# Patient Record
Sex: Female | Born: 1955 | Race: White | Hispanic: No | Marital: Married | Smoking: Never smoker
Health system: Southern US, Community
[De-identification: ages and names within clinical notes are randomized; demographics above are authoritative.]

## PROBLEM LIST (undated history)

## (undated) DIAGNOSIS — D219 Benign neoplasm of connective and other soft tissue, unspecified: Secondary | ICD-10-CM

## (undated) DIAGNOSIS — I10 Essential (primary) hypertension: Secondary | ICD-10-CM

## (undated) DIAGNOSIS — K219 Gastro-esophageal reflux disease without esophagitis: Secondary | ICD-10-CM

## (undated) DIAGNOSIS — E039 Hypothyroidism, unspecified: Secondary | ICD-10-CM

## (undated) DIAGNOSIS — I447 Left bundle-branch block, unspecified: Secondary | ICD-10-CM

## (undated) HISTORY — DX: Hypothyroidism, unspecified: E03.9

## (undated) HISTORY — DX: Essential (primary) hypertension: I10

## (undated) HISTORY — PX: TUBAL LIGATION: SHX77

## (undated) HISTORY — DX: Benign neoplasm of connective and other soft tissue, unspecified: D21.9

## (undated) HISTORY — PX: OTHER SURGICAL HISTORY: SHX169

## (undated) HISTORY — DX: Left bundle-branch block, unspecified: I44.7

## (undated) HISTORY — PX: POLYPECTOMY: SHX149

## (undated) HISTORY — PX: CHOLECYSTECTOMY: SHX55

## (undated) HISTORY — PX: COLONOSCOPY: SHX174

## (undated) HISTORY — PX: MOUTH SURGERY: SHX715

## (undated) HISTORY — DX: Gastro-esophageal reflux disease without esophagitis: K21.9

---

## 1998-05-04 ENCOUNTER — Other Ambulatory Visit: Admission: RE | Admit: 1998-05-04 | Discharge: 1998-05-04 | Payer: Self-pay | Admitting: Obstetrics & Gynecology

## 1999-06-11 ENCOUNTER — Other Ambulatory Visit: Admission: RE | Admit: 1999-06-11 | Discharge: 1999-06-11 | Payer: Self-pay | Admitting: Obstetrics & Gynecology

## 2000-01-07 ENCOUNTER — Encounter: Admission: RE | Admit: 2000-01-07 | Discharge: 2000-01-07 | Payer: Self-pay | Admitting: Internal Medicine

## 2000-01-07 ENCOUNTER — Encounter: Payer: Self-pay | Admitting: Internal Medicine

## 2000-07-24 ENCOUNTER — Other Ambulatory Visit: Admission: RE | Admit: 2000-07-24 | Discharge: 2000-07-24 | Payer: Self-pay | Admitting: Obstetrics & Gynecology

## 2001-01-26 ENCOUNTER — Encounter: Admission: RE | Admit: 2001-01-26 | Discharge: 2001-01-26 | Payer: Self-pay | Admitting: Obstetrics & Gynecology

## 2001-01-26 ENCOUNTER — Encounter: Payer: Self-pay | Admitting: Obstetrics & Gynecology

## 2001-10-04 ENCOUNTER — Encounter: Payer: Self-pay | Admitting: Internal Medicine

## 2001-10-04 ENCOUNTER — Ambulatory Visit (HOSPITAL_COMMUNITY): Admission: RE | Admit: 2001-10-04 | Discharge: 2001-10-04 | Payer: Self-pay | Admitting: Internal Medicine

## 2001-10-08 ENCOUNTER — Encounter: Payer: Self-pay | Admitting: Surgery

## 2001-10-08 ENCOUNTER — Encounter (INDEPENDENT_AMBULATORY_CARE_PROVIDER_SITE_OTHER): Payer: Self-pay | Admitting: *Deleted

## 2001-10-08 ENCOUNTER — Ambulatory Visit (HOSPITAL_COMMUNITY): Admission: RE | Admit: 2001-10-08 | Discharge: 2001-10-09 | Payer: Self-pay | Admitting: Surgery

## 2002-03-12 ENCOUNTER — Other Ambulatory Visit: Admission: RE | Admit: 2002-03-12 | Discharge: 2002-03-12 | Payer: Self-pay | Admitting: Obstetrics & Gynecology

## 2002-03-29 ENCOUNTER — Encounter: Admission: RE | Admit: 2002-03-29 | Discharge: 2002-03-29 | Payer: Self-pay | Admitting: Obstetrics & Gynecology

## 2002-03-29 ENCOUNTER — Encounter: Payer: Self-pay | Admitting: Obstetrics & Gynecology

## 2003-04-11 ENCOUNTER — Encounter: Admission: RE | Admit: 2003-04-11 | Discharge: 2003-04-11 | Payer: Self-pay | Admitting: Obstetrics & Gynecology

## 2003-07-11 ENCOUNTER — Other Ambulatory Visit: Admission: RE | Admit: 2003-07-11 | Discharge: 2003-07-11 | Payer: Self-pay | Admitting: Obstetrics & Gynecology

## 2003-08-21 ENCOUNTER — Encounter: Payer: Self-pay | Admitting: Internal Medicine

## 2004-08-19 ENCOUNTER — Other Ambulatory Visit: Admission: RE | Admit: 2004-08-19 | Discharge: 2004-08-19 | Payer: Self-pay | Admitting: Obstetrics and Gynecology

## 2004-09-09 ENCOUNTER — Encounter: Admission: RE | Admit: 2004-09-09 | Discharge: 2004-09-09 | Payer: Self-pay | Admitting: Obstetrics & Gynecology

## 2004-10-12 ENCOUNTER — Ambulatory Visit: Payer: Self-pay | Admitting: Internal Medicine

## 2005-06-24 ENCOUNTER — Ambulatory Visit: Payer: Self-pay | Admitting: Internal Medicine

## 2005-09-16 ENCOUNTER — Encounter: Admission: RE | Admit: 2005-09-16 | Discharge: 2005-09-16 | Payer: Self-pay | Admitting: Obstetrics & Gynecology

## 2005-10-12 ENCOUNTER — Ambulatory Visit: Payer: Self-pay | Admitting: Internal Medicine

## 2006-04-25 HISTORY — PX: COLONOSCOPY W/ POLYPECTOMY: SHX1380

## 2006-06-09 ENCOUNTER — Ambulatory Visit: Payer: Self-pay | Admitting: Internal Medicine

## 2006-10-17 ENCOUNTER — Encounter: Admission: RE | Admit: 2006-10-17 | Discharge: 2006-10-17 | Payer: Self-pay | Admitting: Obstetrics & Gynecology

## 2006-10-17 ENCOUNTER — Ambulatory Visit: Payer: Self-pay | Admitting: Internal Medicine

## 2006-10-17 DIAGNOSIS — E039 Hypothyroidism, unspecified: Secondary | ICD-10-CM | POA: Insufficient documentation

## 2006-10-17 DIAGNOSIS — E785 Hyperlipidemia, unspecified: Secondary | ICD-10-CM | POA: Insufficient documentation

## 2006-10-17 LAB — CONVERTED CEMR LAB
AST: 28 units/L (ref 0–37)
Basophils Absolute: 0.1 10*3/uL (ref 0.0–0.1)
Basophils Relative: 0.8 % (ref 0.0–1.0)
CO2: 28 meq/L (ref 19–32)
Calcium: 9.3 mg/dL (ref 8.4–10.5)
Eosinophils Relative: 6.9 % — ABNORMAL HIGH (ref 0.0–5.0)
GFR calc Af Amer: 114 mL/min
Glucose, Bld: 95 mg/dL (ref 70–99)
HCT: 41.5 % (ref 36.0–46.0)
HDL: 42.1 mg/dL (ref >39.0)
LDL Cholesterol: 111 mg/dL — ABNORMAL HIGH (ref 0–99)
Lymphocytes Relative: 34 % (ref 12.0–46.0)
Monocytes Relative: 9.2 % (ref 3.0–11.0)
Neutrophils Relative %: 49.1 % (ref 43.0–77.0)
Potassium: 4 meq/L (ref 3.5–5.1)
RDW: 11.7 % (ref 11.5–14.6)
TSH: 0.21 microintl units/mL — ABNORMAL LOW (ref 0.35–5.50)
Total CHOL/HDL Ratio: 4
Triglycerides: 80 mg/dL (ref 0–149)
VLDL: 16 mg/dL (ref 0–40)
WBC: 8.4 10*3/uL (ref 4.5–10.5)

## 2006-10-18 ENCOUNTER — Encounter (INDEPENDENT_AMBULATORY_CARE_PROVIDER_SITE_OTHER): Payer: Self-pay | Admitting: *Deleted

## 2006-12-22 ENCOUNTER — Ambulatory Visit: Payer: Self-pay | Admitting: Gastroenterology

## 2006-12-29 ENCOUNTER — Ambulatory Visit: Payer: Self-pay | Admitting: Gastroenterology

## 2006-12-29 ENCOUNTER — Encounter: Payer: Self-pay | Admitting: Gastroenterology

## 2006-12-29 ENCOUNTER — Encounter: Payer: Self-pay | Admitting: Internal Medicine

## 2007-01-01 LAB — HM COLONOSCOPY: HM COLON: NEGATIVE

## 2007-11-02 ENCOUNTER — Encounter: Admission: RE | Admit: 2007-11-02 | Discharge: 2007-11-02 | Payer: Self-pay | Admitting: Obstetrics & Gynecology

## 2007-11-16 ENCOUNTER — Encounter: Payer: Self-pay | Admitting: Internal Medicine

## 2007-12-13 ENCOUNTER — Ambulatory Visit: Payer: Self-pay | Admitting: Internal Medicine

## 2007-12-13 DIAGNOSIS — I10 Essential (primary) hypertension: Secondary | ICD-10-CM | POA: Insufficient documentation

## 2007-12-13 DIAGNOSIS — E559 Vitamin D deficiency, unspecified: Secondary | ICD-10-CM | POA: Insufficient documentation

## 2007-12-13 LAB — CONVERTED CEMR LAB
HDL goal, serum: 50 mg/dL
LDL Goal: 160 mg/dL

## 2007-12-17 ENCOUNTER — Encounter (INDEPENDENT_AMBULATORY_CARE_PROVIDER_SITE_OTHER): Payer: Self-pay | Admitting: *Deleted

## 2007-12-17 LAB — CONVERTED CEMR LAB
BUN: 7 mg/dL (ref 6–23)
Basophils Relative: 1.4 % (ref 0.0–3.0)
Calcium: 8.9 mg/dL (ref 8.4–10.5)
Cholesterol: 196 mg/dL (ref 0–200)
GFR calc Af Amer: 136 mL/min
GFR calc non Af Amer: 112 mL/min
Glucose, Bld: 84 mg/dL (ref 70–99)
HDL: 33.3 mg/dL — ABNORMAL LOW (ref >39.0)
LDL Cholesterol: 142 mg/dL — ABNORMAL HIGH (ref 0–99)
Lymphocytes Relative: 47.8 % — ABNORMAL HIGH (ref 12.0–46.0)
MCHC: 34.3 g/dL (ref 30.0–36.0)
Monocytes Relative: 10 % (ref 3.0–12.0)
Neutro Abs: 2.5 10*3/uL (ref 1.4–7.7)
Neutrophils Relative %: 34.7 % — ABNORMAL LOW (ref 43.0–77.0)
RBC: 4.21 M/uL (ref 3.87–5.11)
Sodium: 139 meq/L (ref 135–145)
Total Bilirubin: 0.8 mg/dL (ref 0.3–1.2)
VLDL: 21 mg/dL (ref 0–40)

## 2008-11-03 ENCOUNTER — Encounter: Admission: RE | Admit: 2008-11-03 | Discharge: 2008-11-03 | Payer: Self-pay | Admitting: Internal Medicine

## 2008-11-14 ENCOUNTER — Encounter (INDEPENDENT_AMBULATORY_CARE_PROVIDER_SITE_OTHER): Payer: Self-pay | Admitting: *Deleted

## 2009-01-28 ENCOUNTER — Ambulatory Visit: Payer: Self-pay | Admitting: Internal Medicine

## 2009-01-28 DIAGNOSIS — Z860100 Personal history of colon polyps, unspecified: Secondary | ICD-10-CM | POA: Insufficient documentation

## 2009-01-29 ENCOUNTER — Encounter (INDEPENDENT_AMBULATORY_CARE_PROVIDER_SITE_OTHER): Payer: Self-pay | Admitting: *Deleted

## 2009-01-29 LAB — CONVERTED CEMR LAB
Albumin: 4.1 g/dL (ref 3.5–5.2)
BUN: 8 mg/dL (ref 6–23)
Bilirubin, Direct: 0.1 mg/dL (ref 0.0–0.3)
Cholesterol: 161 mg/dL (ref 0–200)
Eosinophils Relative: 7 % — ABNORMAL HIGH (ref 0.0–5.0)
GFR calc non Af Amer: 137.3 mL/min (ref >60)
HDL: 41.4 mg/dL (ref >39.00)
Hemoglobin: 13.8 g/dL (ref 12.0–15.0)
LDL Cholesterol: 104 mg/dL — ABNORMAL HIGH (ref 0–99)
Lymphocytes Relative: 39.1 % (ref 12.0–46.0)
Lymphs Abs: 2.9 10*3/uL (ref 0.7–4.0)
MCV: 94.3 fL (ref 78.0–100.0)
Monocytes Absolute: 0.7 10*3/uL (ref 0.1–1.0)
Monocytes Relative: 9.3 % (ref 3.0–12.0)
Neutro Abs: 3.2 10*3/uL (ref 1.4–7.7)
Neutrophils Relative %: 44.2 % (ref 43.0–77.0)
Sodium: 140 meq/L (ref 135–145)
Total Bilirubin: 0.7 mg/dL (ref 0.3–1.2)
Total CHOL/HDL Ratio: 4
Total Protein: 7.7 g/dL (ref 6.0–8.3)
WBC: 7.3 10*3/uL (ref 4.5–10.5)

## 2009-05-15 ENCOUNTER — Ambulatory Visit: Payer: Self-pay | Admitting: Family

## 2009-09-16 ENCOUNTER — Encounter: Payer: Self-pay | Admitting: Internal Medicine

## 2009-09-16 ENCOUNTER — Ambulatory Visit: Payer: Self-pay | Admitting: Family Medicine

## 2009-09-16 LAB — CONVERTED CEMR LAB
Ketones, urine, test strip: NEGATIVE
Nitrite: NEGATIVE
Protein, U semiquant: NEGATIVE
Urobilinogen, UA: 0.2
pH: 6

## 2009-11-04 ENCOUNTER — Encounter: Admission: RE | Admit: 2009-11-04 | Discharge: 2009-11-04 | Payer: Self-pay | Admitting: Obstetrics & Gynecology

## 2010-02-15 ENCOUNTER — Ambulatory Visit: Payer: Self-pay | Admitting: Internal Medicine

## 2010-02-15 ENCOUNTER — Encounter: Payer: Self-pay | Admitting: Internal Medicine

## 2010-02-16 LAB — CONVERTED CEMR LAB
ALT: 27 units/L (ref 0–35)
AST: 31 units/L (ref 0–37)
Basophils Absolute: 0 10*3/uL (ref 0.0–0.1)
Bilirubin, Direct: 0.1 mg/dL (ref 0.0–0.3)
CO2: 28 meq/L (ref 19–32)
Chloride: 104 meq/L (ref 96–112)
Glucose, Bld: 76 mg/dL (ref 70–99)
HCT: 38.7 % (ref 36.0–46.0)
Hemoglobin: 13.4 g/dL (ref 12.0–15.0)
Lymphocytes Relative: 32.9 % (ref 12.0–46.0)
MCHC: 34.6 g/dL (ref 30.0–36.0)
MCV: 94.7 fL (ref 78.0–100.0)
Platelets: 257 10*3/uL (ref 150.0–400.0)
Potassium: 3.9 meq/L (ref 3.5–5.1)
RDW: 12.9 % (ref 11.5–14.6)
Sodium: 140 meq/L (ref 135–145)
Total Bilirubin: 0.7 mg/dL (ref 0.3–1.2)
Total CHOL/HDL Ratio: 4
VLDL: 13.4 mg/dL (ref 0.0–40.0)

## 2010-05-25 NOTE — Assessment & Plan Note (Signed)
Summary: pain in right ear/lab work tsh   Vital Signs:  Patient profile:   55 year old female Weight:      180.2 pounds BMI:     30.56 Temp:     98.2 degrees F oral Pulse rate:   72 / minute BP sitting:   114 / 70  (left arm) Cuff size:   large  Vitals Entered By: Shonna Chock (May 15, 2009 8:34 AM) CC: Right ear pain, both ears feel full x several days  Comments REVIEWED MED LIST, PATIENT AGREED DOSE AND INSTRUCTION CORRECT    CC:  Right ear pain and both ears feel full x several days .  History of Present Illness: Kaitlin Carter is a 55 year old female patient who presents today with c/o bilateral ear pain right greater than left.  Notes that pain this pain work her up on Wednesday night.  Prior to that she noted some ear fullness. Has taken advil with some relief in her pain.    Allergies: 1)  ! Verapamil 2)  ! Maxzide 3)  ! * Lotrel 4)  ! * Decongestants  Review of Systems       Denies fever of ear drainage.    Physical Exam  General:  Well-developed,well-nourished,in no acute distress; alert,appropriate and cooperative throughout examination Eyes:  PERRLA Ears:  Bilateral TM's mild bulging, pink Mouth:  Oral mucosa and oropharynx without lesions or exudates.  Teeth in good repair. Neck:  No deformities, masses, or tenderness noted. Lungs:  Normal respiratory effort, chest expands symmetrically. Lungs are clear to auscultation, no crackles or wheezes. Heart:  Normal rate and regular rhythm. S1 and S2 normal without gallop, murmur, click, rub or other extra sounds.   Impression & Recommendations:  Problem # 1:  OTITIS MEDIA, ACUTE, BILATERAL (ICD-382.9) Assessment New Patient will hold doxycycline which she has been on for 3 weeks for rosacea.  Plan to treat OM with amoxicillin- then resume doxy.   Her updated medication list for this problem includes:    Amoxicillin 500 Mg Caps (Amoxicillin) ..... One tablet by mouth three times a day x 7 days    Doxycycline  Hyclate 50 Mg Caps (Doxycycline hyclate) ..... One tablet by mouth daily  Complete Medication List: 1)  Synthroid 150 Mcg Tabs (Levothyroxine sodium) .Marland Kitchen.. 1 by mouth once daily except 1/2 q tues & sat 2)  Vit-d 1.25mg   .... 1 by mouth weekly (sunday) 3)  Losartan Potassium 100 Mg Tabs (Losartan potassium) .Marland Kitchen.. 1 once daily 4)  Caltrate 600+d 600-400 Mg-unit Tabs (Calcium carbonate-vitamin d) .Marland Kitchen.. 1 by mouth once daily 5)  Multivitamins Tabs (Multiple vitamin) .Marland Kitchen.. 1 by mouth once daily 6)  Amoxicillin 500 Mg Caps (Amoxicillin) .... One tablet by mouth three times a day x 7 days 7)  Doxycycline Hyclate 50 Mg Caps (Doxycycline hyclate) .... One tablet by mouth daily  Other Orders: TLB-TSH (Thyroid Stimulating Hormone) (84443-TSH)  Patient Instructions: 1)  You can hold doxycycline while you are on amoxicillin. 2)  Call if your symptoms worsen or if they are not better in 1 week.  3)  Use an OTC monistat 7 as needed if you develop a yeast infection. Prescriptions: AMOXICILLIN 500 MG CAPS (AMOXICILLIN) one tablet by mouth three times a day X 7 days  #21 x 0   Entered and Authorized by:   Lemont Fillers FNP   Signed by:   Lemont Fillers FNP on 05/15/2009   Method used:   [facility]  [place] [phone][facility] (retail)       [address] [place], [place]  [address]       Ph: [phone] or [phone]       Fax: [phone]   RxID:   [id number]
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
## 2010-05-25 NOTE — Letter (Signed)
   [facility] [address] [place], [place] [address] [phone]    May 15, 2009   Kaitlin Carter [address] [place], [place] [address]  RE:  LAB RESULTS  Dear  [name],  The following is an interpretation of your most recent lab tests.  Please take note of any instructions provided or changes to medications that have resulted from your lab work.   THYROID STUDIES:  Thyroid studies normal TSH: 3.33      Sincerely Yours,    Lemont Fillers FNP

## 2010-05-25 NOTE — Assessment & Plan Note (Signed)
Summary: URINARY URGENCY/RH......Marland Kitchen   Vital Signs:  Patient profile:   55 year old female Weight:      182 pounds Temp:     98.4 degrees F oral BP sitting:   138 / 80  (left arm)  Vitals Entered By: Doristine Devoid (Sep 16, 2009 1:53 PM) CC: urinary frequency and bloating along w/ stomach discomfort   History of Present Illness: 55 yo woman here today for urinary frequency.  sxs started last week.  noticed after sexual activity.  started drinking lots of water.  on Saturday/"Sunday had constant urge to urinate but had little to no urine.  took pyridium which eased sxs but they returned yesterday afternoon.  now w/ abdominal bloating and 'just not feeling right'.  no dysuria, no hematuria.  cloudy urine.  Current Medications (verified): 1)  Synthroid 150 Mcg  Tabs (Levothyroxine Sodium) .... 1 By Mouth Once Daily Except 1/2 Q Tues & Sat 2)  Vit-D 1.25mg .... 1 By Mouth Weekly (Sunday) 3)  Losartan Potassium 100 Mg Tabs (Losartan Potassium) .... 1 Once Daily 4)  Caltrate 600+d 600-400 Mg-Unit Tabs (Calcium Carbonate-Vitamin D) .... 1 By Mouth Once Daily 5)  Multivitamins  Tabs (Multiple Vitamin) .... 1 By Mouth Once Daily  Allergies (verified): 1)  ! Verapamil 2)  ! Maxzide 3)  ! * Lotrel 4)  ! * Decongestants  Review of Systems      See HPI  Physical Exam  General:  Well-developed,well-nourished,in no acute distress; alert,appropriate and cooperative throughout examination Abdomen:  no CVA tenderness, mild suprapubic pain   Impression & Recommendations:  Problem # 1:  UTI (ICD-599.0) Assessment Unchanged UA consistent w/ infxn.  start abx.  reviewed supportive care and red flags that should prompt return.  Pt expresses understanding and is in agreement w/ this plan. The following medications were removed from the medication list:    Amoxicillin 500 Mg Caps (Amoxicillin) ..... One tablet by mouth three times a day x 7 days    Doxycycline Hyclate 50 Mg Caps (Doxycycline  hyclate) ..... One tablet by mouth daily Her updated medication list for this problem includes:    Cephalexin 500 Mg Tabs (Cephalexin) ..... Take one by mouth 2 times daily x7 days  Orders: UA Dipstick w/o Micro (manual) (81002) Specimen Handling (99000) T-Culture, Urine (87086-70010)  Complete Medication List: 1)  Synthroid 150 Mcg Tabs (Levothyroxine sodium) .... 1 by mouth once daily except 1/2 q tues & sat 2)  Vit-d 1.25mg  .... 1 by mouth weekly (sunday) 3)  Losartan Potassium 100 Mg Tabs (Losartan potassium) .... 1 once daily 4)  Caltrate 600+d 600-400 Mg-unit Tabs (Calcium carbonate-vitamin d) .... 1 by mouth once daily 5)  Multivitamins Tabs (Multiple vitamin) .... 1 by mouth once daily 6)  Cephalexin 500 Mg Tabs (Cephalexin) .... Take one by mouth 2 times daily x7 days  Patient Instructions: 1)  Take the Cephalexin as directed- take w/ food to avoid upset stomach 2)  Drink plenty of fluids 3)  Call with any questions or concerns 4)  Hang in there!! Prescriptions: CEPHALEXIN 500 MG  TABS (CEPHALEXIN) take one by mouth 2 times daily x7 days  #14 x 0   Entered and Authorized by:   Praise Dolecki MD   Signed by:   Ece Cumberland MD on 09/16/2009   Method used:   [facility]  [address] [id number]       [address]  [place], [place]  [address]       Ph: [phone] or [phone]       Fax: [phone]   RxID:   [id number]   Laboratory Results   Urine Tests    Routine Urinalysis   Glucose: negative   (Normal Range: Negative) Bilirubin: negative   (Normal Range: Negative) Ketone: negative   (Normal Range: Negative) Spec. Gravity: 1.015   (Normal Range: 1.003-1.035) Blood: small   (Normal Range: Negative) pH: 6.0   (Normal Range: 5.0-8.0) Protein: negative   (Normal Range: Negative) Urobilinogen: 0.2   (Normal Range: 0-1) Nitrite: negative   (Normal Range: Negative) Leukocyte Esterace: small   (Normal Range: Negative)

## 2010-09-10 NOTE — Op Note (Signed)
. [facility]  Patient:    TANIKA, BRACCO Visit Number: [id number] MRN: [id number]          Service Type: DSU Location: [id number] Attending Physician:  Charlton Haws Dictated by:   Currie Paris, M.D. Proc. Date: 10/08/01 Admit Date:  10/08/2001 Discharge Date: 10/09/2001   CC:         Titus Dubin. Alwyn Ren, M.D. 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  OPERATION:  Laparoscopic cholecystectomy with intraoperative cholangiogram.  SURGEON:  Currie Paris, M.D.  ASSISTANT:  Angelia Mould. Derrell Lolling, M.D.  ANESTHESIA:  General endotracheal.  CLINICAL HISTORY:  Ms. Gauer is a 55 year old who has had three fairly severe episodes of biliary colic in the near past and has had some milder episodes previously.  Ultrasound showed gallstones and question of a little bit of fluid developing around the neck of the gallbladder.  Liver functions were normal.  She was a little bit tender in the right upper quadrant. Options were discussed with her whether or not she would like to go ahead and proceed with cholecystectomy.  It was decided to go ahead and get her on fairly soon since she had had two fairly significant episodes and the question of some developing acute cholecystitis.  DESCRIPTION OF PROCEDURE:  The patient was seen in the holding area and had no further questions.  She was taken to the operating room, and, after satisfactory general endotracheal anesthesia had been obtained, the abdomen was  prepped, and draped.  I used 0.25% plain Marcaine at each incision.  The umbilical incision was made first and the fascia entered under direct vision. A pursestring was placed and the Hasson  introduced.  The abdomen insufflated to 15.  No gross abnormalities were noted.  The patient was placed in reverse Trendelenburg and tilted to the left.  A 10-11 trocar was placed in the epigastrium and two 5 mm trocars laterally. The gallbladder was retracted over the liver.  There was perhaps a little bit of edema around the neck of the gallbladder but no evidence of true acute cholecystitis.  The peritoneum overlying the triangle of Calot was opened, and the perineum was opened up on either side of the gallbladder to try to see well into the triangle.  The cystic duct was well identified and traced out. I could see what I thought initially was the cystic artery, and it came alongside the cystic duct and then curved up along the bed of the gallbladder, and I was not at this point sure whether it curved back into the liver or if it was another branch of the right hepatic or if this was the cystic artery.  At this point, I went ahead and put a clip on the cystic duct near the junction with the gallbladder and opened it.  Intraoperative cholangiography was done and was normal with good filling of the common duct and the hepatic radicles and no filling defects and free flow into the duodenum.  At this point, the cystic duct catheter was removed and three clips placed on the stay side, and it was divided.  Using cautery, I freed up the gallbladder peritoneum a little bit more and followed that small artery up and continued to track along the bed  of the gallbladder.  I saw a couple of adventitial branches that were clipped singly and divided with cautery.  About three-quarters of the way up into the gallbladder, I saw a branch of the artery entering the gallbladder.  This was dissected off, clipped, and divided.  I did not dissect this any further as I was afraid of stirring up some bleeding, but this could have been a branch off of the artery I had seen before, or this artery simply could  have had a high entry on the gallbladder. Nevertheless, there was no bleeding, and I was really almost to the top of the gallbladder at this point.  The remaining peritoneal adhesions were divided.  The gallbladder fossa was irrigated and suctioned out.  There was no bleeding. The gallbladder was brought out the umbilical port easily.  The abdomen was reinflated, and we made a final check for hemostasis which appeared to be complete.  The lateral ports were removed, and there was no bleeding.  The umbilical port pursestring was closed down.  The abdomen was deflated through the epigastric port.  Skin was closed with 4-0 Monocryl subcuticular plus Steri-Strips.   The patient tolerated the procedure well.  There were no operative complications.  All counts were correct. Dictated by:   Currie Paris, M.D. Attending Physician:  Charlton Haws DD:  10/08/01 TD:  10/09/01 Job: [id number]

## 2010-10-19 ENCOUNTER — Other Ambulatory Visit: Payer: Self-pay | Admitting: Obstetrics & Gynecology

## 2010-10-19 DIAGNOSIS — Z1231 Encounter for screening mammogram for malignant neoplasm of breast: Secondary | ICD-10-CM

## 2010-11-08 ENCOUNTER — Ambulatory Visit
Admission: RE | Admit: 2010-11-08 | Discharge: 2010-11-08 | Disposition: A | Discharge status: Home / Self Care | Payer: BC Managed Care – PPO | Source: Ambulatory Visit | Attending: Obstetrics & Gynecology | Admitting: Obstetrics & Gynecology

## 2010-11-08 DIAGNOSIS — Z1231 Encounter for screening mammogram for malignant neoplasm of breast: Secondary | ICD-10-CM

## 2010-11-24 ENCOUNTER — Other Ambulatory Visit: Payer: Self-pay | Admitting: Internal Medicine

## 2010-11-24 MED ORDER — SYNTHROID 150 MCG PO TABS: 150.0000 ug | ORAL_TABLET | Freq: Every day | ORAL | Status: DC

## 2011-02-15 ENCOUNTER — Encounter: Payer: Self-pay | Admitting: Internal Medicine

## 2011-02-17 ENCOUNTER — Encounter: Payer: Self-pay | Admitting: Internal Medicine

## 2011-02-17 ENCOUNTER — Ambulatory Visit (INDEPENDENT_AMBULATORY_CARE_PROVIDER_SITE_OTHER): Payer: BC Managed Care – PPO | Admitting: Internal Medicine

## 2011-02-17 VITALS — BP 128/76 | HR 66 | Temp 98.4°F | Resp 12 | Ht 64.5 in | Wt 176.8 lb

## 2011-02-17 DIAGNOSIS — E039 Hypothyroidism, unspecified: Secondary | ICD-10-CM

## 2011-02-17 DIAGNOSIS — E559 Vitamin D deficiency, unspecified: Secondary | ICD-10-CM

## 2011-02-17 DIAGNOSIS — Z86010 Personal history of colonic polyps: Secondary | ICD-10-CM

## 2011-02-17 DIAGNOSIS — I447 Left bundle-branch block, unspecified: Secondary | ICD-10-CM

## 2011-02-17 DIAGNOSIS — Z0000 Encounter for general adult medical examination without abnormal findings: Secondary | ICD-10-CM

## 2011-02-17 DIAGNOSIS — E785 Hyperlipidemia, unspecified: Secondary | ICD-10-CM

## 2011-02-17 DIAGNOSIS — I10 Essential (primary) hypertension: Secondary | ICD-10-CM

## 2011-02-17 LAB — CBC WITH DIFFERENTIAL/PLATELET
Basophils Absolute: 0 10*3/uL (ref 0.0–0.1)
Basophils Relative: 0.5 % (ref 0.0–3.0)
Hemoglobin: 14 g/dL (ref 12.0–15.0)
Lymphs Abs: 2.4 10*3/uL (ref 0.7–4.0)
Monocytes Relative: 8.5 % (ref 3.0–12.0)
Neutro Abs: 4.3 10*3/uL (ref 1.4–7.7)
Neutrophils Relative %: 52.9 % (ref 43.0–77.0)
Platelets: 253 10*3/uL (ref 150.0–400.0)
RBC: 4.38 Mil/uL (ref 3.87–5.11)
RDW: 12.5 % (ref 11.5–14.6)
WBC: 8.1 10*3/uL (ref 4.5–10.5)

## 2011-02-17 LAB — BASIC METABOLIC PANEL
CO2: 29 mEq/L (ref 19–32)
Creatinine, Ser: 0.8 mg/dL (ref 0.4–1.2)
Glucose, Bld: 87 mg/dL (ref 70–99)
Potassium: 4.2 mEq/L (ref 3.5–5.1)

## 2011-02-17 LAB — TSH: TSH: 1.18 u[IU]/mL (ref 0.35–5.50)

## 2011-02-17 LAB — HEPATIC FUNCTION PANEL
AST: 34 U/L (ref 0–37)
Albumin: 4.4 g/dL (ref 3.5–5.2)
Alkaline Phosphatase: 67 U/L (ref 39–117)
Total Protein: 8.2 g/dL (ref 6.0–8.3)

## 2011-02-17 NOTE — Patient Instructions (Signed)
Preventive Health Care: Exercise  30-45  minutes a day, 3-4 days a week, once cleared by Cardiology. Walking is especially valuable in preventing Osteoporosis. Eat a low-fat diet with lots of fruits and vegetables, up to 7-9 servings per day.Consume less than 30 grams of sugar per day from foods & drinks with High Fructose Corn Syrup as # 1,2,3 or #4 on label.

## 2011-02-17 NOTE — Progress Notes (Signed)
Subjective:    Patient ID: Kaitlin Carter, female    DOB: 07/04/1955, 55 y.o.   MRN: [id number]  HPI  Kaitlin Carter  is here for a physical; she has no acute issues.       Review of Systems Patient reports no vision/ hearing  changes, adenopathy,fever, weight change,  persistant / recurrent hoarseness , swallowing issues, chest pain,palpitations,edema,persistant /recurrent cough, hemoptysis, dyspnea( rest/ exertional/paroxysmal nocturnal), gastrointestinal bleeding(melena, rectal bleeding), abdominal pain, significant heartburn,  bowel changes,GU symptoms(dysuria, hematuria,pyuria, incontinence), Gyn symptoms(abnormal  bleeding , pain),  syncope, focal weakness, memory loss,numbness & tingling, skin/hair /nail changes,abnormal bruising or bleeding, anxiety,or depression.  She's had some postnasal drainage related to raking leads. She denies any significant symptoms of rhinoconjunctivitis. She has not had menses for 3 months; her gynecologist office has prescribed Prometrium as a trial.  She does not  exercise on regular basis; she is on no specific dietary program.     Objective:   Physical Exam Gen.: Healthy and well-nourished in appearance. Alert, appropriate and cooperative throughout exam. Head: Normocephalic without obvious abnormalities  Eyes: No corneal or conjunctival inflammation noted. Pupils equal round reactive to light and accommodation. Fundal exam is benign without hemorrhages, exudate, papilledema. Extraocular motion intact. Vision grossly normal. Ears: External  ear exam reveals no significant lesions or deformities. Canals clear .TMs normal. Hearing is grossly normal bilaterally. Nose: External nasal exam reveals no deformity or inflammation. Nasal mucosa are pink and moist. No lesions or exudates noted.  Mouth: Oral mucosa and oropharynx reveal no lesions or exudates. Teeth in good repair. Neck: No deformities, masses, or tenderness noted. Range of motion &  Thyroid  normal. Lungs: Normal respiratory effort; chest expands symmetrically. Lungs are clear to auscultation without rales, wheezes, or increased work of breathing. Heart: Normal rate and rhythm. Normal S1 and S2. No gallop, click, or rub. No murmur. Abdomen: Bowel sounds normal; abdomen soft and nontender. No masses, organomegaly or hernias noted. Genitalia: as per Gyn   .                                                                                   Musculoskeletal/extremities: No deformity or scoliosis noted of  the thoracic or lumbar spine. No clubbing, cyanosis, edema, or deformity noted. Range of motion  normal .Tone & strength  Normal.Joints: minor DIP OA changes. Nail health  good. Vascular: Carotid, radial artery, dorsalis pedis and  posterior tibial pulses are full and equal. No bruits present. Neurologic: Alert and oriented x3. Deep tendon reflexes symmetrical and normal.          Skin: Intact without suspicious lesions or rashes. Lymph: No cervical, axillary lymphadenopathy present. Psych: Mood and affect are normal. Normally interactive  Assessment & Plan:  #1 comprehensive physical exam; no acute findings #2 see Problem List with Assessments & Recommendations  #3 strong family history of coronary disease. Premature MI in 1 maternal aunt  #4 left bundle branch block; new  #5 probable perimenopausal status. She is aware of the increased risk of cardiovascular disease in women in the  postmenopausal state. Plan: see Orders

## 2011-02-18 ENCOUNTER — Encounter: Payer: Self-pay | Admitting: Internal Medicine

## 2011-02-25 ENCOUNTER — Telehealth: Payer: Self-pay | Admitting: Internal Medicine

## 2011-02-25 NOTE — Telephone Encounter (Signed)
Lab result note only calls for OTC Loratadine.

## 2011-02-27 ENCOUNTER — Encounter: Payer: Self-pay | Admitting: Internal Medicine

## 2011-02-28 ENCOUNTER — Encounter: Payer: Self-pay | Admitting: Cardiology

## 2011-02-28 ENCOUNTER — Ambulatory Visit (INDEPENDENT_AMBULATORY_CARE_PROVIDER_SITE_OTHER): Payer: BC Managed Care – PPO | Admitting: Cardiology

## 2011-02-28 VITALS — BP 132/82 | HR 67 | Ht 64.5 in | Wt 177.0 lb

## 2011-02-28 DIAGNOSIS — E785 Hyperlipidemia, unspecified: Secondary | ICD-10-CM

## 2011-02-28 DIAGNOSIS — I447 Left bundle-branch block, unspecified: Secondary | ICD-10-CM

## 2011-02-28 DIAGNOSIS — I10 Essential (primary) hypertension: Secondary | ICD-10-CM

## 2011-02-28 NOTE — Patient Instructions (Signed)
Your physician has requested that you have an adenosine myoview. For further information please visit [email or web address] Please follow instruction sheet, as given.  Your physician recommends that you schedule a follow-up appointment in: 2 weeks with Dr Shirlee Latch. You do not need to keep this appointment if the adenosine myoview is normal.

## 2011-02-28 NOTE — Progress Notes (Signed)
PCP: Dr. Alwyn Ren  55 yo with history of HTN presents for evaluation of new LBBB.  Patient was noted by Dr. Alwyn Ren to have LBBB on ECG done for her generral physical.  Her prior ECG in 2011 did not show a LBBB.  ECG in this office today shows a LBBB.  She does seem to be asymptomatic from a cardiopulmonary standpoint.  No exertional chest pain.  No exertional dyspnea.  She has good exercise tolerance.  No syncope or lightheadedness.  BP has been well-controlled on valsartan.  No prior cardiac history.   Labs (10/12): K 4.2, creatinine 0.8, TSH normal  ECG: NSR, LBBB  PMH: 1. HTN 2. Hypothyroidism 3. LBBB  SH: Married, nonsmoker, Sales executive.   FH: Grandfather with MI in his early 1s, father with MI late 83s, aunt MI 91s, uncle CAD diagnosed in his 57s.   ROS: All systems reviewed and negative except as per HPI.   Current Outpatient Prescriptions  Medication Sig Dispense Refill  . Calcium-Magnesium-Vitamin D (CITRACAL CALCIUM+D PO) Take by mouth. Daily (slow release)       . Cholecalciferol (VITAMIN D3) 1000 UNITS CAPS Take by mouth daily.        Marland Kitchen levothyroxine (SYNTHROID) 150 MCG tablet Take 150 mcg by mouth daily. Wednesday and Saturday take one half tablet       . losartan (COZAAR) 100 MG tablet Take 100 mg by mouth daily.        . Multiple Vitamin (MULTIVITAMIN) tablet Take 1 tablet by mouth daily.        . vitamin B-12 (CYANOCOBALAMIN) 1000 MCG tablet Take 1,000 mcg by mouth daily.          BP 132/82  Pulse 67  Ht 5' 4.5" (1.638 m)  Wt 177 lb (80.287 kg)  BMI 29.91 kg/m2 General: NAD Neck: No JVD, no thyromegaly or thyroid nodule.  Lungs: Clear to auscultation bilaterally with normal respiratory effort. CV: Nondisplaced PMI.  Heart regular S1/S2, paradoxical S2 split, no S3/S4, no murmur.  No peripheral edema.  No carotid bruit.  Normal pedal pulses.  Abdomen: Soft, nontender, no hepatosplenomegaly, no distention.  Skin: Intact without lesions or rashes.  Neurologic:  Alert and oriented x 3.  Psych: Normal affect. Extremities: No clubbing or cyanosis.  HEENT: Normal.

## 2011-03-01 DIAGNOSIS — I447 Left bundle-branch block, unspecified: Secondary | ICD-10-CM | POA: Insufficient documentation

## 2011-03-01 NOTE — Assessment & Plan Note (Signed)
Awaiting recently sent [facility] Panel.

## 2011-03-01 NOTE — Assessment & Plan Note (Signed)
BP appears controlled.

## 2011-03-01 NOTE — Assessment & Plan Note (Signed)
LBBB is new from 2011.  Patient has no significant cardiopulmonary symptoms.  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  However, cannot rule out CAD as a cause.  She has had no symptoms suggestive of higher grade block.  I will arrange an adenosine myoview to rule out significant ischemia.

## 2011-03-02 ENCOUNTER — Other Ambulatory Visit: Payer: Self-pay | Admitting: Internal Medicine

## 2011-03-02 MED ORDER — LEVOTHYROXINE SODIUM 150 MCG PO TABS: 150.0000 ug | ORAL_TABLET | Freq: Every day | ORAL | Status: DC

## 2011-03-02 MED ORDER — LOSARTAN POTASSIUM 100 MG PO TABS: 100.0000 mg | ORAL_TABLET | Freq: Every day | ORAL | Status: DC

## 2011-03-02 NOTE — Telephone Encounter (Signed)
RXs sent.

## 2011-03-07 ENCOUNTER — Encounter: Payer: Self-pay | Admitting: Internal Medicine

## 2011-03-08 ENCOUNTER — Ambulatory Visit (HOSPITAL_COMMUNITY): Payer: BC Managed Care – PPO | Attending: Cardiology | Admitting: Radiology

## 2011-03-08 VITALS — Ht 64.5 in | Wt 173.0 lb

## 2011-03-08 DIAGNOSIS — E785 Hyperlipidemia, unspecified: Secondary | ICD-10-CM | POA: Insufficient documentation

## 2011-03-08 DIAGNOSIS — Z8249 Family history of ischemic heart disease and other diseases of the circulatory system: Secondary | ICD-10-CM | POA: Insufficient documentation

## 2011-03-08 DIAGNOSIS — I447 Left bundle-branch block, unspecified: Secondary | ICD-10-CM | POA: Insufficient documentation

## 2011-03-08 DIAGNOSIS — R9431 Abnormal electrocardiogram [ECG] [EKG]: Secondary | ICD-10-CM

## 2011-03-08 DIAGNOSIS — I10 Essential (primary) hypertension: Secondary | ICD-10-CM | POA: Insufficient documentation

## 2011-03-08 MED ORDER — TECHNETIUM TC 99M TETROFOSMIN IV KIT
33.0000 | PACK | Freq: Once | INTRAVENOUS | Status: AC | PRN
  Administered 2011-03-08: 33 via INTRAVENOUS

## 2011-03-08 MED ORDER — ADENOSINE (DIAGNOSTIC) 3 MG/ML IV SOLN
0.8400 mg/kg | Freq: Once | INTRAVENOUS | Status: AC
  Administered 2011-03-08: 66 mg via INTRAVENOUS

## 2011-03-08 MED ORDER — TECHNETIUM TC 99M TETROFOSMIN IV KIT
11.0000 | PACK | Freq: Once | INTRAVENOUS | Status: AC | PRN
  Administered 2011-03-08: 11 via INTRAVENOUS

## 2011-03-08 NOTE — Progress Notes (Addendum)
[facility] SITE 3 NUCLEAR MED [address] [place] [address] [phone]  Cardiology Nuclear Med Study  Kaitlin Carter is a 55 y.o. female [id number] [birth year]   Nuclear Med Background Indication for Stress Test:  Evaluation for Ischemia and Abnormal EKG-New LBBB History:  No previous documented CAD Cardiac Risk Factors: Family History - CAD, Hypertension, New LBBB and Lipids  Symptoms:  No cardiac symptoms.   Nuclear Pre-Procedure Caffeine/Decaff Intake:  None NPO After: 7:30pm   Lungs:  Clear.  O2 SAT 98% on RA. IV 0.9% NS with Angio Cath:  20g  IV Site: R Hand  IV Started by:  Cathlyn Parsons, RN  Chest Size (in):  36 Cup Size: D  Height: 5' 4.5" (1.638 m)  Weight:  173 lb (78.472 kg)  BMI:  Body mass index is 29.24 kg/(m^2). Tech Comments:  n/a    Nuclear Med Study 1 or 2 day study: 1 day  Stress Test Type:  Adenosine  Reading MD: Olga Millers, MD  Order Authorizing Provider:  Marca Ancona, MD  Resting Radionuclide: Technetium 33m Tetrofosmin  Resting Radionuclide Dose: 11 mCi   Stress Radionuclide:  Technetium 72m Tetrofosmin  Stress Radionuclide Dose: 33 mCi           Stress Protocol Rest HR: 59 Stress HR: 91  Rest BP: 106/74 Stress BP: 92/71  Exercise Time (min): n/a METS: n/a   Predicted Max HR: 166 bpm % Max HR: 54.82 bpm Rate Pressure Product: 8372   Dose of Adenosine (mg):  44.0 Dose of Lexiscan: n/a mg  Dose of Atropine (mg): n/a Dose of Dobutamine: n/a mcg/kg/min (at max HR)  Stress Test Technologist: Smiley Houseman, CMA-N  Nuclear Technologist:  Doyne Keel, CNMT     Rest Procedure:  Myocardial perfusion imaging was performed at rest 45 minutes following the intravenous administration of Technetium 72m Tetrofosmin.  Rest ECG: LBBB  Stress Procedure:  The patient received IV adenosine at 140 mcg/kg/min for 4 minutes.  There were episodes of complete heart block with infusion.  She did c/o chest tightness with  infusion during heart block.  Technetium 78m Tetrofosmin was injected at the 2 minute mark and quantitative spect images were obtained after a 45 minute delay.  Stress ECG: Uninteretable due to baseline LBBB  QPS Raw Data Images:  Acquisition technically good; normal left ventricular size. Stress Images:  There is decreased uptake in the septum. Rest Images:  There is decreased uptake in the septum. Subtraction (SDS):  No evidence of ischemia. Transient Ischemic Dilatation (Normal <1.22):  1.20 Lung/Heart Ratio (Normal <0.45):  .36  Quantitative Gated Spect Images QGS EDV:  70 ml QGS ESV:  22 ml QGS cine images:  NL LV Function; NL Wall Motion QGS EF: 69%  Impression Exercise Capacity:  Adenosine study with no exercise. BP Response:  Normal blood pressure response. Clinical Symptoms:  There is chest pain. ECG Impression:  Baseline:  LBBB.  EKG uninterpretable due to LBBB at rest and stress. Comparison with Prior Nuclear Study: No previous nuclear study performed  Overall Impression:  Probably normal stress nuclear study with a small fixed anteroseptal defect possibly related to patient's LBBB; no ischemia.   Olga Millers  Probably normal with small fixed anteroseptal defect likely due to LBBB.  EF is normal.   Marca Ancona

## 2011-03-10 NOTE — Progress Notes (Signed)
Pt notified of results--she will keep appt 03/21/11 with Dr Shirlee Latch

## 2011-03-10 NOTE — Progress Notes (Signed)
appt 03/21/11 with Dr Shirlee Latch

## 2011-03-21 ENCOUNTER — Encounter: Payer: Self-pay | Admitting: Cardiology

## 2011-03-21 ENCOUNTER — Ambulatory Visit (INDEPENDENT_AMBULATORY_CARE_PROVIDER_SITE_OTHER): Payer: BC Managed Care – PPO | Admitting: Cardiology

## 2011-03-21 DIAGNOSIS — I447 Left bundle-branch block, unspecified: Secondary | ICD-10-CM

## 2011-03-21 DIAGNOSIS — E7800 Pure hypercholesterolemia, unspecified: Secondary | ICD-10-CM

## 2011-03-21 DIAGNOSIS — E785 Hyperlipidemia, unspecified: Secondary | ICD-10-CM

## 2011-03-21 MED ORDER — ATORVASTATIN CALCIUM 10 MG PO TABS: 10.0000 mg | ORAL_TABLET | Freq: Every day | ORAL | Status: DC

## 2011-03-21 NOTE — Patient Instructions (Signed)
Start lipitor (atorvastatin) 10mg  daily.  Take coenzyme Q10 200mg  daily.  Start aspirin 81mg  daily. This should be enteric coated.  Your physician recommends that you return for a FASTING lipid profile /liver profile/  Hs  CRP in 2 months 272.0  426.3  Your physician wants you to follow-up in: 1 year with Dr Shirlee Latch. ( November 2013). You will receive a reminder letter in the mail two months in advance. If you don't receive a letter, please call our office to schedule the follow-up appointment.

## 2011-03-21 NOTE — Assessment & Plan Note (Signed)
LDL is 105 but hs-CRP is elevated.  Given her family history and results of the JUPITER trial, I will have her start low dose atorvastatin 10 mg daily with lipids/LFTs in 2 months.   I will see her back in 1 year for followup.

## 2011-03-21 NOTE — Progress Notes (Signed)
PCP: Dr. Alwyn Ren  55 yo with history of HTN presents for evaluation of new LBBB.  Patient was noted by Dr. Alwyn Ren to have LBBB on ECG done for her generral physical.  Her prior ECG in 2011 did not show a LBBB.  ECG in this office also showed a LBBB.  She does seem to be asymptomatic from a cardiopulmonary standpoint.  No exertional chest pain.  No exertional dyspnea.  She has good exercise tolerance.  No syncope or lightheadedness.  BP has been well-controlled on valsartan.  No prior cardiac history.   I had her do an adenosine myoview given the LBBB.  This showed a small anteroseptal fixed defect that was most likely artifact due to the LBBB.  There was no ischemia and EF was normal.    Labs (10/12): K 4.2, creatinine 0.8, TSH normal Labs (10/12): [facility] LDL 105, HDl 56, Lp(a) < 15, LpPLA2 borderline, hsCRP 3.6 (high), NT-proBNP 37  ECG: NSR, LBBB  PMH: 1. HTN 2. Hypothyroidism 3. LBBB 4. Adenosine myoview (11/12): EF 69%, no ischemia, small fixed anteroseptal defect may have been artifact from LBBB.  5. Factor V Leiden heterozygote.   SH: Married, nonsmoker, Sales executive.   FH: Grandfather with MI in his early 60s, father with MI late 47s, aunt MI 37s, uncle CAD diagnosed in his 41s.   ROS: All systems reviewed and negative except as per HPI.   Current Outpatient Prescriptions  Medication Sig Dispense Refill  . Calcium Citrate-Vitamin D (CITRACAL MAXIMUM PO) Take by mouth.        . Calcium-Magnesium-Vitamin D (CITRACAL CALCIUM+D PO) Take by mouth. Daily (slow release)       . Cholecalciferol (VITAMIN D3) 1000 UNITS CAPS Take by mouth daily.        Marland Kitchen levothyroxine (SYNTHROID) 150 MCG tablet Take 1 tablet (150 mcg total) by mouth daily. Wednesday and Saturday take one half tablet  90 tablet  3  . losartan (COZAAR) 100 MG tablet Take 1 tablet (100 mg total) by mouth daily.  90 tablet  3  . Multiple Vitamin (MULTIVITAMIN) tablet Take 1 tablet by mouth daily.        .  vitamin B-12 (CYANOCOBALAMIN) 1000 MCG tablet Take 1,000 mcg by mouth daily.        Marland Kitchen aspirin EC 81 MG tablet Take 1 tablet (81 mg total) by mouth daily.      Marland Kitchen atorvastatin (LIPITOR) 10 MG tablet Take 1 tablet (10 mg total) by mouth daily.  30 tablet  3  . Coenzyme Q10 200 MG TABS Take by mouth.    0  . DISCONTD: atorvastatin (LIPITOR) 10 MG tablet Take 1 tablet (10 mg total) by mouth daily.  30 tablet  3    BP 117/80  Pulse 61  Ht 5' 4.5" (1.638 m)  Wt 77.166 kg (170 lb 1.9 oz)  BMI 28.75 kg/m2 General: NAD Neck: No JVD, no thyromegaly or thyroid nodule.  Lungs: Clear to auscultation bilaterally with normal respiratory effort. CV: Nondisplaced PMI.  Heart regular S1/S2, paradoxical S2 split, no S3/S4, no murmur.  No peripheral edema.  No carotid bruit.  Normal pedal pulses.  Abdomen: Soft, nontender, no hepatosplenomegaly, no distention.  Neurologic: Alert and oriented x 3.  Psych: Normal affect. Extremities: No clubbing or cyanosis.

## 2011-03-21 NOTE — Assessment & Plan Note (Addendum)
No ischemic-type symptoms.  Adenosine myoview showed normal EF with no ischemia.  There was a small fixed anteroseptal perfusion defect.  This is the typical area for artifact from LBBB, so I suspect that this finding was related to artifact.  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  Kaitlin Carter does have a strong family history of CAD and is a Factor V Leiden heterozygote.  Therefore, I will ask her to take ASA 81 mg daily.

## 2011-03-23 ENCOUNTER — Telehealth: Payer: Self-pay | Admitting: Cardiology

## 2011-03-23 NOTE — Telephone Encounter (Signed)
Pt has a question about lipitor because it was sent into two different Pharm and it was a 30 day supply she needs a 90 day supply

## 2011-03-24 ENCOUNTER — Telehealth: Payer: Self-pay | Admitting: *Deleted

## 2011-03-24 NOTE — Telephone Encounter (Signed)
lmom 

## 2011-03-24 NOTE — Telephone Encounter (Signed)
Lmom

## 2011-03-24 NOTE — Telephone Encounter (Signed)
I called pt to see which pharm to send med to. I got the answering machine and left messaqge for her to call [place] and ask for you, and to let you know I called her. Im in [place] today. If I have time I will try to get back in contact with her at a later time

## 2011-03-25 NOTE — Telephone Encounter (Signed)
LMTCB

## 2011-03-25 NOTE — Telephone Encounter (Signed)
Message on answering machine states office closed until Monday morning. LMTCB at alternate number.

## 2011-03-25 NOTE — Telephone Encounter (Signed)
Fu call °Pt returning your call  °

## 2011-03-29 NOTE — Telephone Encounter (Signed)
I talked with pt about atorvastatin refills. Pt scheduled to have repeat lab 05/20/11. Pt will get prescription locally until repeat lab 05/20/11 then will make a decision about mail order for 90 days.

## 2011-05-20 ENCOUNTER — Other Ambulatory Visit: Payer: BC Managed Care – PPO | Admitting: *Deleted

## 2011-05-25 ENCOUNTER — Other Ambulatory Visit: Payer: BC Managed Care – PPO | Admitting: *Deleted

## 2011-05-27 ENCOUNTER — Telehealth: Payer: Self-pay | Admitting: Cardiology

## 2011-05-27 ENCOUNTER — Other Ambulatory Visit (INDEPENDENT_AMBULATORY_CARE_PROVIDER_SITE_OTHER): Payer: BC Managed Care – PPO | Admitting: *Deleted

## 2011-05-27 DIAGNOSIS — E7800 Pure hypercholesterolemia, unspecified: Secondary | ICD-10-CM

## 2011-05-27 DIAGNOSIS — I447 Left bundle-branch block, unspecified: Secondary | ICD-10-CM

## 2011-05-27 LAB — HEPATIC FUNCTION PANEL
Bilirubin, Direct: 0 mg/dL (ref 0.0–0.3)
Total Bilirubin: 0.5 mg/dL (ref 0.3–1.2)

## 2011-05-27 LAB — HIGH SENSITIVITY CRP: CRP, High Sensitivity: 2.77 mg/L (ref 0.000–5.000)

## 2011-05-27 LAB — LIPID PANEL
LDL Cholesterol: 58 mg/dL (ref 0–99)
Total CHOL/HDL Ratio: 2
VLDL: 9 mg/dL (ref 0.0–40.0)

## 2011-05-27 NOTE — Telephone Encounter (Signed)
Fu call °Patient returning your call °

## 2011-05-27 NOTE — Telephone Encounter (Signed)
Talked with pt 

## 2011-05-30 ENCOUNTER — Other Ambulatory Visit: Payer: Self-pay | Admitting: Cardiology

## 2011-05-30 DIAGNOSIS — E7800 Pure hypercholesterolemia, unspecified: Secondary | ICD-10-CM

## 2011-05-30 DIAGNOSIS — I447 Left bundle-branch block, unspecified: Secondary | ICD-10-CM

## 2011-05-30 MED ORDER — ATORVASTATIN CALCIUM 10 MG PO TABS: 10.0000 mg | ORAL_TABLET | Freq: Every day | ORAL | Status: DC

## 2011-05-30 NOTE — Telephone Encounter (Signed)
New Msg: pt needs 90 day supply called into [facility].

## 2011-06-14 ENCOUNTER — Other Ambulatory Visit: Payer: Self-pay | Admitting: *Deleted

## 2011-06-14 DIAGNOSIS — E7800 Pure hypercholesterolemia, unspecified: Secondary | ICD-10-CM

## 2011-06-14 DIAGNOSIS — I447 Left bundle-branch block, unspecified: Secondary | ICD-10-CM

## 2011-06-14 MED ORDER — ATORVASTATIN CALCIUM 10 MG PO TABS: 10.0000 mg | ORAL_TABLET | Freq: Every day | ORAL | Status: DC

## 2011-10-06 ENCOUNTER — Other Ambulatory Visit: Payer: Self-pay | Admitting: Obstetrics & Gynecology

## 2011-10-06 DIAGNOSIS — Z1231 Encounter for screening mammogram for malignant neoplasm of breast: Secondary | ICD-10-CM

## 2011-11-10 ENCOUNTER — Ambulatory Visit
Admission: RE | Admit: 2011-11-10 | Discharge: 2011-11-10 | Disposition: A | Discharge status: Home / Self Care | Payer: BC Managed Care – PPO | Source: Ambulatory Visit | Attending: Obstetrics & Gynecology | Admitting: Obstetrics & Gynecology

## 2011-11-10 DIAGNOSIS — Z1231 Encounter for screening mammogram for malignant neoplasm of breast: Secondary | ICD-10-CM

## 2012-02-29 ENCOUNTER — Ambulatory Visit (INDEPENDENT_AMBULATORY_CARE_PROVIDER_SITE_OTHER): Payer: BC Managed Care – PPO | Admitting: Internal Medicine

## 2012-02-29 ENCOUNTER — Encounter: Payer: Self-pay | Admitting: Internal Medicine

## 2012-02-29 VITALS — BP 118/82 | HR 63 | Temp 97.8°F | Resp 12 | Ht 64.5 in | Wt 176.0 lb

## 2012-02-29 DIAGNOSIS — E785 Hyperlipidemia, unspecified: Secondary | ICD-10-CM

## 2012-02-29 DIAGNOSIS — Z0000 Encounter for general adult medical examination without abnormal findings: Secondary | ICD-10-CM

## 2012-02-29 DIAGNOSIS — K219 Gastro-esophageal reflux disease without esophagitis: Secondary | ICD-10-CM

## 2012-02-29 DIAGNOSIS — I447 Left bundle-branch block, unspecified: Secondary | ICD-10-CM

## 2012-02-29 LAB — CBC WITH DIFFERENTIAL/PLATELET
Basophils Absolute: 0 10*3/uL (ref 0.0–0.1)
Eosinophils Absolute: 0.6 10*3/uL (ref 0.0–0.7)
Eosinophils Relative: 8.7 % — ABNORMAL HIGH (ref 0.0–5.0)
MCV: 93.6 fl (ref 78.0–100.0)
Monocytes Absolute: 0.6 10*3/uL (ref 0.1–1.0)
Neutrophils Relative %: 44.9 % (ref 43.0–77.0)
Platelets: 235 10*3/uL (ref 150.0–400.0)
WBC: 6.6 10*3/uL (ref 4.5–10.5)

## 2012-02-29 LAB — LIPID PANEL
HDL: 51.4 mg/dL (ref >39.00)
LDL Cholesterol: 69 mg/dL (ref 0–99)
Total CHOL/HDL Ratio: 3
Triglycerides: 69 mg/dL (ref 0.0–149.0)
VLDL: 13.8 mg/dL (ref 0.0–40.0)

## 2012-02-29 LAB — HEPATIC FUNCTION PANEL
Bilirubin, Direct: 0.1 mg/dL (ref 0.0–0.3)
Total Bilirubin: 0.6 mg/dL (ref 0.3–1.2)

## 2012-02-29 LAB — BASIC METABOLIC PANEL
BUN: 14 mg/dL (ref 6–23)
Chloride: 102 mEq/L (ref 96–112)
Creatinine, Ser: 0.7 mg/dL (ref 0.4–1.2)

## 2012-02-29 MED ORDER — LOSARTAN POTASSIUM 100 MG PO TABS: 100.0000 mg | ORAL_TABLET | Freq: Every day | ORAL | Status: DC

## 2012-02-29 MED ORDER — LEVOTHYROXINE SODIUM 150 MCG PO TABS: 150.0000 ug | ORAL_TABLET | Freq: Every day | ORAL | Status: DC

## 2012-02-29 NOTE — Patient Instructions (Addendum)
Preventive Health Care: Exercise  30-45  minutes a day, 3-4 days a week. Walking is especially valuable in preventing Osteoporosis. Eat a low-fat diet with lots of fruits and vegetables, up to 7-9 servings per day.  Consume less than 30 grams of sugar per day from foods & drinks with High Fructose Corn Syrup as #1,2,3 or #4 on label. Blood Pressure Goal  Ideally is an AVERAGE < 135/85. This AVERAGE should be calculated from @ least 5-7 BP readings taken @ different times of day on different days of week. You should not respond to isolated BP readings , but rather the AVERAGE for that week.  The triggers for reflux   include stress; the "aspirin family" ; alcohol; peppermint; and caffeine (coffee, tea, cola, and chocolate). The aspirin family would include aspirin and the nonsteroidal agents such as ibuprofen &  Naproxen. Tylenol would not cause reflux. If having symptoms ; food & drink should be avoided for @ least 2 hours before going to bed.  Prilosec OTC 30 minutes before breakfast for 6-8 weeks. If symptoms are not dramatically better; please schedule appt with Dr Christella Hartigan  If you activate My Chart; the results can be released to you as soon as they populate from the lab. If you choose not to use this program; the labs have to be reviewed, copied & mailed   causing a delay in getting the results to you.

## 2012-02-29 NOTE — Progress Notes (Signed)
  Subjective:    Patient ID: Kaitlin Carter, female    DOB: Sep 07, 1955, 56 y.o.   MRN: [id number]  HPI  Kaitlin Carter is here for a physical; she denies active or acute issues.      Review of Systems  She describes frequent burping even if she she fasts. She experiences epigastric discomfort at night 1-2 times per month which awakens her. It occurred last night.She denies persistent hoarseness, frank dysphasia, unexplained weight loss,  melena, or rectal bleeding. She denies exertional chest pain, dyspnea,.palpitations, or claudication. A nuclear stress test done 2012 for new left bundle-branch block was negative.     Objective:   Physical Exam Gen.:  well-nourished in appearance. Alert, appropriate and cooperative throughout exam. Head: Normocephalic without obvious abnormalities  Eyes: No corneal or conjunctival inflammation noted. Pupils equal round reactive to light and accommodation. Fundal exam is benign without hemorrhages, exudate, papilledema. Extraocular motion intact. Vision grossly normal. Ears: External  ear exam reveals no significant lesions or deformities. Canals clear .TMs normal. Hearing is grossly normal bilaterally. Nose: External nasal exam reveals no deformity or inflammation. Nasal mucosa are pink and moist. No lesions or exudates noted.   Mouth: Oral mucosa and oropharynx reveal no lesions or exudates. Teeth in good repair. Neck: No deformities, masses, or tenderness noted. Range of motion & Thyroid normal. Lungs: Normal respiratory effort; chest expands symmetrically. Lungs are clear to auscultation without rales, wheezes, or increased work of breathing. Heart: Normal rate and rhythm. Normal S1 and S2. No gallop, click, or rub.S 4 w/o murmur. Abdomen: Bowel sounds normal; abdomen soft and nontender. No masses, organomegaly or hernias noted. Genitalia: Dr Jennette Kettle, Gyn                                                    Musculoskeletal/extremities: No deformity or scoliosis  noted of  the thoracic or lumbar spine. No clubbing, cyanosis,or  edema noted. Range of motion  normal .Tone & strength  Normal.Minor DIP osteoarthritic finger changes . Nail health  good. Vascular: Carotid, radial artery, dorsalis pedis and  posterior tibial pulses are full and equal. No bruits present. Neurologic: Alert and oriented x3. Deep tendon reflexes symmetrical and normal.          Skin: Intact without suspicious lesions or rashes. Lymph: No cervical, axillary lymphadenopathy present. Psych: Mood and affect are normal. Normally interactive                                                                                       Assessment & Plan:  #1 comprehensive physical exam; no acute findings #2 discomfort; hiatal hernia is suggested.  Repeat EKG needed.  GERD/hiatal hernia measures. #3 left bundle branch block and left axis deviation, resolved. I will request followup with Dr. Shirlee Latch. Plan: see Orders

## 2012-03-02 ENCOUNTER — Encounter: Payer: Self-pay | Admitting: Internal Medicine

## 2012-03-28 ENCOUNTER — Ambulatory Visit: Payer: BC Managed Care – PPO | Admitting: Internal Medicine

## 2012-03-29 ENCOUNTER — Ambulatory Visit: Payer: BC Managed Care – PPO | Admitting: Cardiology

## 2012-04-24 ENCOUNTER — Other Ambulatory Visit (INDEPENDENT_AMBULATORY_CARE_PROVIDER_SITE_OTHER): Payer: BC Managed Care – PPO

## 2012-04-24 DIAGNOSIS — E039 Hypothyroidism, unspecified: Secondary | ICD-10-CM

## 2012-04-24 DIAGNOSIS — R7402 Elevation of levels of lactic acid dehydrogenase (LDH): Secondary | ICD-10-CM

## 2012-04-26 ENCOUNTER — Ambulatory Visit (INDEPENDENT_AMBULATORY_CARE_PROVIDER_SITE_OTHER): Payer: BC Managed Care – PPO | Admitting: Cardiology

## 2012-04-26 VITALS — BP 118/70 | Ht 64.5 in | Wt 182.6 lb

## 2012-04-26 DIAGNOSIS — E785 Hyperlipidemia, unspecified: Secondary | ICD-10-CM

## 2012-04-26 DIAGNOSIS — I447 Left bundle-branch block, unspecified: Secondary | ICD-10-CM

## 2012-04-26 DIAGNOSIS — R079 Chest pain, unspecified: Secondary | ICD-10-CM

## 2012-04-26 DIAGNOSIS — E7800 Pure hypercholesterolemia, unspecified: Secondary | ICD-10-CM

## 2012-04-26 MED ORDER — ATORVASTATIN CALCIUM 10 MG PO TABS: 10.0000 mg | ORAL_TABLET | Freq: Every day | ORAL | Status: DC

## 2012-04-26 NOTE — Patient Instructions (Signed)
Your physician recommends that you schedule a follow-up appointment as needed with Dr McLean.  

## 2012-04-27 ENCOUNTER — Encounter: Payer: Self-pay | Admitting: Cardiology

## 2012-04-27 DIAGNOSIS — R079 Chest pain, unspecified: Secondary | ICD-10-CM | POA: Insufficient documentation

## 2012-04-27 NOTE — Progress Notes (Signed)
Patient ID: Kaitlin Carter, female   DOB: [birth year], 57 y.o.   MRN: [id number] PCP: Dr. Alwyn Ren  57 yo with history of HTN presented initially last year for evaluation of new LBBB.  Patient was noted by Dr. Alwyn Ren to have LBBB on ECG done in 2012.  Her prior ECG in 2011 did not show a LBBB.  ECG in this office also showed a LBBB.  I had her do an adenosine myoview given the LBBB.  This showed a small anteroseptal fixed defect that was most likely artifact due to the LBBB.  There was no ischemia and EF was normal.    She was seen by Dr. Alwyn Ren for her physical in 2013, and interestingly, the LBBB was no longer present.  ECG in this office today also does not show a LBBB.  It is of note that the ECGs with LBBB were at a slightly higher HR than the ECGs without LBBB.  Symptomatically, she continues to do well.  No exertional chest pain or dyspnea.  She does sometimes feel a heaviness in her central chest.  This has occurred periodically for a long time.  It is not associated with exertion.  Sometimes it is accompanied by belching.  She has tried Prilosec and is not sure that it helped much.   Labs (10/12): K 4.2, creatinine 0.8, TSH normal Labs (10/12): [facility] LDL 105, HDl 56, Lp(a) < 15, LpPLA2 borderline, hsCRP 3.6 (high), NT-proBNP 37 Labs (11/13): K 3.9, creatinine 0.7, LDL 69, HDL 51  ECG: NSR, normal  PMH: 1. HTN 2. Hypothyroidism 3. LBBB 4. Adenosine myoview (11/12): EF 69%, no ischemia, small fixed anteroseptal defect may have been artifact from LBBB.  5. Factor V Leiden heterozygote.   SH: Married, nonsmoker, Sales executive.   FH: Grandfather with MI in his early 34s, father with MI late 57s, aunt MI 57s, uncle CAD diagnosed in his 87s.   Current Outpatient Prescriptions  Medication Sig Dispense Refill  . aspirin EC 81 MG tablet Take 1 tablet (81 mg total) by mouth daily.      Marland Kitchen atorvastatin (LIPITOR) 10 MG tablet Take 1 tablet (10 mg total) by mouth daily.  90  tablet  0  . Cholecalciferol (VITAMIN D3) 1000 UNITS CAPS Take by mouth daily.        . Coenzyme Q10 200 MG TABS Take by mouth.    0  . levothyroxine (SYNTHROID) 150 MCG tablet Take 1 tablet (150 mcg total) by mouth daily. Wednesday and Saturday take one half tablet  90 tablet  3  . losartan (COZAAR) 100 MG tablet Take 1 tablet (100 mg total) by mouth daily.  90 tablet  3  . Multiple Vitamin (MULTIVITAMIN) tablet Take 1 tablet by mouth daily.        . Omega-3 Fatty Acids (FISH OIL) 1200 MG CAPS Take by mouth daily.        BP 118/70  Ht 5' 4.5" (1.638 m)  Wt 182 lb 9.6 oz (82.827 kg)  BMI 30.86 kg/m2 General: NAD Neck: No JVD, no thyromegaly or thyroid nodule.  Lungs: Clear to auscultation bilaterally with normal respiratory effort. CV: Nondisplaced PMI.  Heart regular S1/S2, paradoxical S2 split, no S3/S4, no murmur.  No peripheral edema.  No carotid bruit.  Normal pedal pulses.  Abdomen: Soft, nontender, no hepatosplenomegaly, no distention.  Neurologic: Alert and oriented x 3.  Psych: Normal affect. Extremities: No clubbing or cyanosis.   Assessment/Plan:  LBBB Last year, patient had  a LBBB on ECG.  This year, she does not have a conduction abnormality.  It is hard to explain this change.  However, the ECGs with LBBB had a higher rate than the ECGs without conduction abnormality.  Perhaps it is rate-related.  I tried walking her in the office today, but HR had fallen back down into the 50s by the time I could get her ECG done and no LBBB was seen.  Chest pain Atypical, suspect noncardiac.  Maybe related to GERD.  She is taking Prilosec.  Hyperlipidemia Given her family history of CAD, I think that it is reasonable for her to atorvastatin as well as ASA 81.  Good lipids in 11/13.   Marca Ancona 04/27/2012

## 2012-08-28 ENCOUNTER — Encounter: Payer: Self-pay | Admitting: Gastroenterology

## 2012-09-23 LAB — HM PAP SMEAR: HM PAP: NORMAL

## 2012-09-25 ENCOUNTER — Ambulatory Visit (INDEPENDENT_AMBULATORY_CARE_PROVIDER_SITE_OTHER): Payer: BC Managed Care – PPO | Admitting: Gastroenterology

## 2012-09-25 ENCOUNTER — Encounter: Payer: Self-pay | Admitting: Gastroenterology

## 2012-09-25 VITALS — BP 130/70 | HR 64 | Ht 64.5 in | Wt 182.2 lb

## 2012-09-25 DIAGNOSIS — R1013 Epigastric pain: Secondary | ICD-10-CM

## 2012-09-25 DIAGNOSIS — F458 Other somatoform disorders: Secondary | ICD-10-CM

## 2012-09-25 DIAGNOSIS — R198 Other specified symptoms and signs involving the digestive system and abdomen: Secondary | ICD-10-CM

## 2012-09-25 DIAGNOSIS — K3189 Other diseases of stomach and duodenum: Secondary | ICD-10-CM

## 2012-09-25 DIAGNOSIS — R0989 Other specified symptoms and signs involving the circulatory and respiratory systems: Secondary | ICD-10-CM

## 2012-09-25 NOTE — Patient Instructions (Addendum)
You will be set up for an upper endoscopy for your throat sensation, intermittent nausea.                                              We are excited to introduce MyChart, a new best-in-class service that provides you online access to important information in your electronic medical record. We want to make it easier for you to view your health information - all in one secure location - when and where you need it. We expect MyChart will enhance the quality of care and service we provide.  When you register for MyChart, you can:    View your test results.    Request appointments and receive appointment reminders via email.    Request medication renewals.    View your medical history, allergies, medications and immunizations.    Communicate with your physician's office through a password-protected site.    Conveniently print information such as your medication lists.  To find out if MyChart is right for you, please talk to a member of our clinical staff today. We will gladly answer your questions about this free health and wellness tool.  If you are age 41 or older and want a member of your family to have access to your record, you must provide written consent by completing a proxy form available at our office. Please speak to our clinical staff about guidelines regarding accounts for patients younger than age 58.  As you activate your MyChart account and need any technical assistance, please call the MyChart technical support line at [phone]) or email your question to [email or web address]. If you email your question(s), please include your name, a return phone number and the best time to reach you.  If you have non-urgent health-related questions, you can send a message to our office through MyChart at [email or web address]. If you have a medical emergency, call 911.  Thank you for using MyChart as your new health and wellness resource!   MyChart licensed from  [employer],  [id number]. Patents Pending.
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
## 2012-10-22 ENCOUNTER — Other Ambulatory Visit: Payer: BC Managed Care – PPO | Admitting: Gastroenterology

## 2012-12-03 ENCOUNTER — Other Ambulatory Visit: Payer: Self-pay

## 2012-12-03 DIAGNOSIS — Z1231 Encounter for screening mammogram for malignant neoplasm of breast: Secondary | ICD-10-CM

## 2012-12-20 ENCOUNTER — Ambulatory Visit
Admission: RE | Admit: 2012-12-20 | Discharge: 2012-12-20 | Disposition: A | Discharge status: Home / Self Care | Payer: BC Managed Care – PPO | Source: Ambulatory Visit

## 2012-12-20 DIAGNOSIS — Z1231 Encounter for screening mammogram for malignant neoplasm of breast: Secondary | ICD-10-CM

## 2013-01-07 ENCOUNTER — Other Ambulatory Visit: Payer: Self-pay

## 2013-01-07 DIAGNOSIS — I447 Left bundle-branch block, unspecified: Secondary | ICD-10-CM

## 2013-01-07 DIAGNOSIS — E7800 Pure hypercholesterolemia, unspecified: Secondary | ICD-10-CM

## 2013-01-07 MED ORDER — ATORVASTATIN CALCIUM 10 MG PO TABS: 10.0000 mg | ORAL_TABLET | Freq: Every day | ORAL | Status: DC

## 2013-01-30 ENCOUNTER — Other Ambulatory Visit: Payer: Self-pay | Admitting: *Deleted

## 2013-01-30 MED ORDER — LOSARTAN POTASSIUM 100 MG PO TABS: 100.0000 mg | ORAL_TABLET | Freq: Every day | ORAL | Status: DC

## 2013-01-30 NOTE — Telephone Encounter (Signed)
Losartan refill sent to pharmacy 

## 2013-02-28 ENCOUNTER — Other Ambulatory Visit: Payer: Self-pay

## 2013-03-04 ENCOUNTER — Other Ambulatory Visit: Payer: Self-pay | Admitting: *Deleted

## 2013-03-04 MED ORDER — LEVOTHYROXINE SODIUM 150 MCG PO TABS: 150.0000 ug | ORAL_TABLET | Freq: Every day | ORAL | Status: DC

## 2013-03-11 ENCOUNTER — Other Ambulatory Visit: Payer: Self-pay | Admitting: *Deleted

## 2013-03-11 ENCOUNTER — Encounter: Payer: Self-pay | Admitting: Internal Medicine

## 2013-03-11 MED ORDER — LOSARTAN POTASSIUM 100 MG PO TABS: 100.0000 mg | ORAL_TABLET | Freq: Every day | ORAL | Status: DC

## 2013-03-11 NOTE — Telephone Encounter (Signed)
Losartan refilled for 90 days. Pt made an appt for January 2015

## 2013-05-02 ENCOUNTER — Telehealth: Payer: Self-pay

## 2013-05-02 NOTE — Telephone Encounter (Signed)
Left message for call back  identifiable  Pap--Dr Nori Riis [facility]--12/2006--Dr Sallyanne Kuster polyp [facility]--12/2012--neg

## 2013-05-03 ENCOUNTER — Encounter: Payer: Self-pay | Admitting: Internal Medicine

## 2013-05-03 ENCOUNTER — Ambulatory Visit (INDEPENDENT_AMBULATORY_CARE_PROVIDER_SITE_OTHER): Payer: BC Managed Care – PPO | Admitting: Internal Medicine

## 2013-05-03 VITALS — BP 126/80 | HR 73 | Temp 98.4°F | Ht 64.5 in | Wt 184.8 lb

## 2013-05-03 DIAGNOSIS — E7800 Pure hypercholesterolemia, unspecified: Secondary | ICD-10-CM

## 2013-05-03 DIAGNOSIS — Z0000 Encounter for general adult medical examination without abnormal findings: Secondary | ICD-10-CM

## 2013-05-03 DIAGNOSIS — I447 Left bundle-branch block, unspecified: Secondary | ICD-10-CM

## 2013-05-03 LAB — BASIC METABOLIC PANEL
BUN: 11 mg/dL (ref 6–23)
CHLORIDE: 105 meq/L (ref 96–112)
CO2: 29 mEq/L (ref 19–32)
CREATININE: 0.8 mg/dL (ref 0.4–1.2)
Calcium: 9.3 mg/dL (ref 8.4–10.5)
GFR: 82.11 mL/min (ref >60.00)
Glucose, Bld: 91 mg/dL (ref 70–99)
POTASSIUM: 3.9 meq/L (ref 3.5–5.1)
Sodium: 140 mEq/L (ref 135–145)

## 2013-05-03 LAB — CBC WITH DIFFERENTIAL/PLATELET
BASOS ABS: 0 10*3/uL (ref 0.0–0.1)
Basophils Relative: 0.4 % (ref 0.0–3.0)
Eosinophils Absolute: 0.5 10*3/uL (ref 0.0–0.7)
Eosinophils Relative: 6.5 % — ABNORMAL HIGH (ref 0.0–5.0)
HCT: 42.1 % (ref 36.0–46.0)
Hemoglobin: 14.3 g/dL (ref 12.0–15.0)
LYMPHS PCT: 42.9 % (ref 12.0–46.0)
Lymphs Abs: 3 10*3/uL (ref 0.7–4.0)
MCHC: 33.9 g/dL (ref 30.0–36.0)
MCV: 92.9 fl (ref 78.0–100.0)
MONOS PCT: 8 % (ref 3.0–12.0)
Monocytes Absolute: 0.6 10*3/uL (ref 0.1–1.0)
NEUTROS ABS: 3 10*3/uL (ref 1.4–7.7)
Neutrophils Relative %: 42.2 % — ABNORMAL LOW (ref 43.0–77.0)
PLATELETS: 225 10*3/uL (ref 150.0–400.0)
RBC: 4.53 Mil/uL (ref 3.87–5.11)
RDW: 12.3 % (ref 11.5–14.6)
WBC: 7.1 10*3/uL (ref 4.5–10.5)

## 2013-05-03 LAB — HEPATIC FUNCTION PANEL
ALBUMIN: 4.2 g/dL (ref 3.5–5.2)
ALK PHOS: 68 U/L (ref 39–117)
ALT: 37 U/L — ABNORMAL HIGH (ref 0–35)
AST: 34 U/L (ref 0–37)
BILIRUBIN TOTAL: 0.6 mg/dL (ref 0.3–1.2)
Bilirubin, Direct: 0.1 mg/dL (ref 0.0–0.3)
Total Protein: 7.9 g/dL (ref 6.0–8.3)

## 2013-05-03 LAB — TSH: TSH: 0.85 u[IU]/mL (ref 0.35–5.50)

## 2013-05-03 LAB — LIPID PANEL
CHOLESTEROL: 154 mg/dL (ref 0–200)
HDL: 46.6 mg/dL (ref >39.00)
LDL Cholesterol: 88 mg/dL (ref 0–99)
Total CHOL/HDL Ratio: 3
Triglycerides: 95 mg/dL (ref 0.0–149.0)
VLDL: 19 mg/dL (ref 0.0–40.0)

## 2013-05-03 LAB — C-REACTIVE PROTEIN

## 2013-05-03 MED ORDER — ATORVASTATIN CALCIUM 10 MG PO TABS: 10.0000 mg | ORAL_TABLET | Freq: Every day | ORAL | Status: DC

## 2013-05-03 MED ORDER — LOSARTAN POTASSIUM 100 MG PO TABS: 100.0000 mg | ORAL_TABLET | Freq: Every day | ORAL | Status: DC

## 2013-05-03 MED ORDER — LEVOTHYROXINE SODIUM 150 MCG PO TABS: ORAL_TABLET | ORAL | Status: DC

## 2013-05-03 NOTE — Progress Notes (Signed)
   Subjective:    Patient ID: Huey Romans, female    DOB: 10-Mar-1956, 58 y.o.   MRN: [id number]  HPI  Rosea is here for a physical;acute issues denied.     Review of Systems A heart healthy diet is followed; exercise encompasses 30-45 minutes 3  times per week as walking without symptoms.  Family history is positive for premature coronary disease in an aunt. Advanced cholesterol testing reveals  LDL goal is less than 150 ; ideally < 115 . There is medication compliance with the statin.  Low dose ASA taken Specifically denied are  chest pain, palpitations, dyspnea, or claudication.  Significant abdominal symptoms, memory deficit, or myalgias not present.     Objective:   Physical Exam Gen.: Healthy and well-nourished in appearance. Alert, appropriate and cooperative throughout exam.Appears younger than stated age  Head: Normocephalic without obvious abnormalities  Eyes: No corneal or conjunctival inflammation noted. Pupils equal round reactive to light and accommodation. Extraocular motion intact. Ears: External  ear exam reveals no significant lesions or deformities. Canals clear .TMs normal. Hearing is grossly normal bilaterally. Nose: External nasal exam reveals no deformity or inflammation. Nasal mucosa are pink and moist. No lesions or exudates noted.  Mouth: Oral mucosa and oropharynx reveal no lesions or exudates. Teeth in good repair. Neck: No deformities, masses, or tenderness noted. Range of motion & Thyroid normal. Lungs: Normal respiratory effort; chest expands symmetrically. Lungs are clear to auscultation without rales, wheezes, or increased work of breathing. Heart: Normal rate and rhythm. Normal S1 and S2. No gallop, click, or rub. S4 w/o murmur. Abdomen: Bowel sounds normal; abdomen soft and nontender. No masses, organomegaly or hernias noted. Genitalia:  as per Gyn                                  Musculoskeletal/extremities: No deformity or scoliosis noted of   the thoracic or lumbar spine.  No clubbing, cyanosis, edema, or significant extremity  deformity noted. Range of motion normal .Tone & strength normal. Hand joints  reveal minor  DJD DIP changes. Fingernail  health good. Able to lie down & sit up w/o help. Negative SLR bilaterally Vascular: Carotid, radial artery, dorsalis pedis and  posterior tibial pulses are full and equal. No bruits present. Neurologic: Alert and oriented x3. Deep tendon reflexes symmetrical and normal.        Skin: Intact without suspicious lesions or rashes. Lymph: No cervical, axillary lymphadenopathy present. Psych: Mood and affect are normal. Normally interactive                                                                                        Assessment & Plan:  #1 comprehensive physical exam; no acute findings  Plan: see Orders  & Recommendations

## 2013-05-03 NOTE — Progress Notes (Signed)
Pre visit review using our clinic review tool, if applicable. No additional management support is needed unless otherwise documented below in the visit note. 

## 2013-05-03 NOTE — Patient Instructions (Signed)
Your next office appointment will be determined based upon review of your pending labs. Those instructions will be transmitted to you through My Chart  . Cardiovascular exercise, this can be as simple a program as walking, is recommended 30-45 minutes 3-4 times per week.

## 2013-05-04 ENCOUNTER — Encounter: Payer: Self-pay | Admitting: Internal Medicine

## 2013-05-06 ENCOUNTER — Other Ambulatory Visit: Payer: Self-pay | Admitting: Internal Medicine

## 2013-05-06 DIAGNOSIS — Z780 Asymptomatic menopausal state: Secondary | ICD-10-CM

## 2013-05-06 DIAGNOSIS — E559 Vitamin D deficiency, unspecified: Secondary | ICD-10-CM

## 2013-05-07 NOTE — Telephone Encounter (Signed)
Unable to reach pre visit.  

## 2013-05-08 LAB — VITAMIN D 1,25 DIHYDROXY
Vitamin D 1, 25 (OH)2 Total: 76 pg/mL — ABNORMAL HIGH (ref 18–72)
Vitamin D2 1, 25 (OH)2: <8 pg/mL
Vitamin D3 1, 25 (OH)2: 76 pg/mL

## 2013-05-13 ENCOUNTER — Other Ambulatory Visit: Payer: BC Managed Care – PPO

## 2013-05-17 ENCOUNTER — Ambulatory Visit (INDEPENDENT_AMBULATORY_CARE_PROVIDER_SITE_OTHER)
Admission: RE | Admit: 2013-05-17 | Discharge: 2013-05-17 | Disposition: A | Discharge status: Home / Self Care | Payer: BC Managed Care – PPO | Source: Ambulatory Visit | Attending: Internal Medicine | Admitting: Internal Medicine

## 2013-05-17 DIAGNOSIS — Z780 Asymptomatic menopausal state: Secondary | ICD-10-CM

## 2013-05-17 DIAGNOSIS — E559 Vitamin D deficiency, unspecified: Secondary | ICD-10-CM

## 2013-08-20 ENCOUNTER — Other Ambulatory Visit: Payer: Self-pay | Admitting: Internal Medicine

## 2013-08-28 ENCOUNTER — Other Ambulatory Visit: Payer: Self-pay

## 2013-08-28 MED ORDER — LEVOTHYROXINE SODIUM 150 MCG PO TABS: ORAL_TABLET | ORAL | Status: DC

## 2013-11-26 ENCOUNTER — Other Ambulatory Visit: Payer: Self-pay

## 2013-11-26 DIAGNOSIS — Z1231 Encounter for screening mammogram for malignant neoplasm of breast: Secondary | ICD-10-CM

## 2013-12-24 ENCOUNTER — Ambulatory Visit
Admission: RE | Admit: 2013-12-24 | Discharge: 2013-12-24 | Disposition: A | Discharge status: Home / Self Care | Payer: BC Managed Care – PPO | Source: Ambulatory Visit

## 2013-12-24 DIAGNOSIS — Z1231 Encounter for screening mammogram for malignant neoplasm of breast: Secondary | ICD-10-CM

## 2014-02-18 ENCOUNTER — Other Ambulatory Visit: Payer: Self-pay | Admitting: Internal Medicine

## 2014-02-18 ENCOUNTER — Other Ambulatory Visit: Payer: Self-pay | Admitting: Obstetrics and Gynecology

## 2014-02-19 LAB — CYTOLOGY - PAP

## 2014-05-09 ENCOUNTER — Other Ambulatory Visit (INDEPENDENT_AMBULATORY_CARE_PROVIDER_SITE_OTHER): Payer: PRIVATE HEALTH INSURANCE

## 2014-05-09 ENCOUNTER — Other Ambulatory Visit: Payer: Self-pay | Admitting: Internal Medicine

## 2014-05-09 ENCOUNTER — Encounter: Payer: Self-pay | Admitting: Internal Medicine

## 2014-05-09 ENCOUNTER — Ambulatory Visit (INDEPENDENT_AMBULATORY_CARE_PROVIDER_SITE_OTHER): Payer: PRIVATE HEALTH INSURANCE | Admitting: Internal Medicine

## 2014-05-09 VITALS — BP 116/80 | HR 65 | Temp 98.1°F | Resp 14 | Ht 65.0 in | Wt 184.8 lb

## 2014-05-09 DIAGNOSIS — Z0189 Encounter for other specified special examinations: Secondary | ICD-10-CM

## 2014-05-09 DIAGNOSIS — Z0000 Encounter for general adult medical examination without abnormal findings: Secondary | ICD-10-CM

## 2014-05-09 DIAGNOSIS — I10 Essential (primary) hypertension: Secondary | ICD-10-CM

## 2014-05-09 DIAGNOSIS — J310 Chronic rhinitis: Secondary | ICD-10-CM

## 2014-05-09 DIAGNOSIS — E785 Hyperlipidemia, unspecified: Secondary | ICD-10-CM

## 2014-05-09 DIAGNOSIS — E038 Other specified hypothyroidism: Secondary | ICD-10-CM

## 2014-05-09 LAB — CBC WITH DIFFERENTIAL/PLATELET
Basophils Absolute: 0.1 10*3/uL (ref 0.0–0.1)
Basophils Relative: 0.7 % (ref 0.0–3.0)
EOS ABS: 0.6 10*3/uL (ref 0.0–0.7)
Eosinophils Relative: 5.7 % — ABNORMAL HIGH (ref 0.0–5.0)
HCT: 43.2 % (ref 36.0–46.0)
Hemoglobin: 14.4 g/dL (ref 12.0–15.0)
Lymphocytes Relative: 28.6 % (ref 12.0–46.0)
Lymphs Abs: 3.2 10*3/uL (ref 0.7–4.0)
MCHC: 33.4 g/dL (ref 30.0–36.0)
MCV: 93.4 fl (ref 78.0–100.0)
MONO ABS: 0.8 10*3/uL (ref 0.1–1.0)
Monocytes Relative: 6.8 % (ref 3.0–12.0)
NEUTROS PCT: 58.2 % (ref 43.0–77.0)
Neutro Abs: 6.4 10*3/uL (ref 1.4–7.7)
Platelets: 237 10*3/uL (ref 150.0–400.0)
RBC: 4.62 Mil/uL (ref 3.87–5.11)
RDW: 12.7 % (ref 11.5–15.5)
WBC: 11.1 10*3/uL — AB (ref 4.0–10.5)

## 2014-05-09 LAB — BASIC METABOLIC PANEL
BUN: 10 mg/dL (ref 6–23)
CALCIUM: 9.6 mg/dL (ref 8.4–10.5)
CHLORIDE: 104 meq/L (ref 96–112)
CO2: 29 mEq/L (ref 19–32)
CREATININE: 0.69 mg/dL (ref 0.40–1.20)
GFR: 92.86 mL/min (ref >60.00)
Glucose, Bld: 90 mg/dL (ref 70–99)
Potassium: 3.9 mEq/L (ref 3.5–5.1)
Sodium: 141 mEq/L (ref 135–145)

## 2014-05-09 LAB — HEPATIC FUNCTION PANEL
ALBUMIN: 4.4 g/dL (ref 3.5–5.2)
ALT: 38 U/L — AB (ref 0–35)
AST: 35 U/L (ref 0–37)
Alkaline Phosphatase: 87 U/L (ref 39–117)
Bilirubin, Direct: 0.1 mg/dL (ref 0.0–0.3)
Total Bilirubin: 0.5 mg/dL (ref 0.2–1.2)
Total Protein: 7.9 g/dL (ref 6.0–8.3)

## 2014-05-09 LAB — C-REACTIVE PROTEIN: CRP: 0.3 mg/dL — ABNORMAL LOW (ref 0.5–20.0)

## 2014-05-09 LAB — CK: Total CK: 108 U/L (ref 7–177)

## 2014-05-09 LAB — VITAMIN D 25 HYDROXY (VIT D DEFICIENCY, FRACTURES): VITD: 31 ng/mL (ref 30.00–100.00)

## 2014-05-09 LAB — TSH: TSH: 1.17 u[IU]/mL (ref 0.35–4.50)

## 2014-05-09 NOTE — Progress Notes (Signed)
   Subjective:    Patient ID: Kaitlin Carter, female    DOB: 08/30/1955, 59 y.o.   MRN: [id number]  HPI  She is here for a physical;acute issues denied except for minor URT rhinitis type symptoms..   She is compliant with her medications without adverse effects  She is on a heart healthy, low-salt diet.  She's walking on treadmill 2-3 times per week for 45 minutes without cardiopulmonary symptoms.  Colonoscopy is up-to-date; she had a hyperplastic polyp in 2008.   BP not monitored @ home    Review of Systems   Significant headaches, epistaxis, chest pain, palpitations, exertional dyspnea, claudication, paroxysmal nocturnal dyspnea, or edema absent. No GI symptoms , memory loss or myalgias except intermittent leg discomfort in evening. Stretching helps.  Unexplained weight loss, abdominal pain, significant dyspepsia, dysphagia, melena, rectal bleeding, or persistently small caliber stools are denied.  Active rhinitis w/o purulence , sinus pain or fever.       Objective:   Physical Exam  Gen.: Healthy and well-nourished in appearance. Alert, appropriate and cooperative throughout exam. Appears younger than stated age  Head: Normocephalic without obvious abnormalities  Eyes: No corneal or conjunctival inflammation noted. Pupils equal round reactive to light and accommodation. Extraocular motion intact.  Ears: External  ear exam reveals no significant lesions or deformities. Canals clear .TMs normal. Hearing is grossly normal bilaterally. Nose: External nasal exam reveals no deformity or inflammation. Nasal mucosa are pink and moist. No lesions or exudates noted.   Mouth: Oral mucosa and oropharynx reveal no lesions or exudates. Teeth in good repair. Neck: No deformities, masses, or tenderness noted. Range of motion & Thyroid normal Lungs: Normal respiratory effort; chest expands symmetrically. Lungs are clear to auscultation without rales, wheezes, or increased work of  breathing. Heart: Normal rate and rhythm. Normal S1 and S2. No gallop, click, or rub.No murmur. Abdomen: Bowel sounds normal; abdomen soft and nontender. No masses, organomegaly or hernias noted. Genitalia:  as per Gyn                                  Musculoskeletal/extremities: No significant deformity or scoliosis noted of  the thoracic or lumbar spine. No clubbing, cyanosis, edema, or significant extremity  deformity noted.  Range of motion normal . Tone & strength normal. Hand joints normal Fingernail  health good. Minor crepitus of knees  Able to lie down & sit up w/o help.  Negative SLR bilaterally Vascular: Carotid, radial artery, dorsalis pedis and  posterior tibial pulses are full and equal. No bruits present. Neurologic: Alert and oriented x3. Deep tendon reflexes symmetrical and normal.  Gait normal    Skin: Intact without suspicious lesions or rashes. Lymph: No cervical, axillary lymphadenopathy present. Psych: Mood and affect are normal. Normally interactive                                                                                      Assessment & Plan:  #1 comprehensive physical exam; no acute findings #2 non allergic rhinitis  Plan: see Orders  & Recommendations & AVS

## 2014-05-09 NOTE — Progress Notes (Signed)
Pre visit review using our clinic review tool, if applicable. No additional management support is needed unless otherwise documented below in the visit note. 

## 2014-05-09 NOTE — Patient Instructions (Addendum)
Your next office appointment will be determined based upon review of your pending labs . Those instructions will be transmitted to you through My Chart .  Minimal Blood Pressure Goal= AVERAGE < 140/90;  Ideal is an AVERAGE < 135/85. This AVERAGE should be calculated from @ least 5-7 BP readings taken @ different times of day on different days of week. You should not respond to isolated BP readings , but rather the AVERAGE for that week .Please bring your  blood pressure cuff to office visits to verify that it is reliable.It  can also be checked against the blood pressure device at the pharmacy. Finger or wrist cuffs are not dependable; an arm cuff is.  Plain Mucinex (NOT D) for thick secretions ;force NON dairy fluids .   Nasal cleansing in the shower as discussed with lather of mild shampoo.After 10 seconds wash off lather while  exhaling through nostrils. Make sure that all residual soap is removed to prevent irritation.  Flonase OR Nasacort AQ 1 spray in each nostril twice a day as needed. Use the "crossover" technique into opposite nostril spraying toward opposite ear @ 45 degree angle, not straight up into nostril.  Plain Allegra (NOT D )  160 daily , Loratidine 10 mg , OR Zyrtec 10 mg @ bedtime  as needed for itchy eyes & sneezing.  Zicam Melts or Zinc lozenges as per package label for sore throat . Complementary options include  vitamin C 2000 mg daily; & Echinacea for 4-7 days.  Report fever; discolored nasal or chest secretions; or frontal headache or facial pain  .

## 2014-05-13 LAB — NMR LIPOPROFILE WITH LIPIDS
CHOLESTEROL, TOTAL: 143 mg/dL (ref 100–199)
HDL PARTICLE NUMBER: 31.7 umol/L (ref >=30.5)
HDL Size: 9.4 nm (ref >=9.2)
HDL-C: 59 mg/dL (ref >39)
LDL (calc): 67 mg/dL (ref 0–99)
LDL PARTICLE NUMBER: 702 nmol/L (ref <1000)
LDL Size: 21.1 nm (ref >=20.8)
LP-IR Score: 30 (ref <=45)
Large HDL-P: 6.7 umol/L (ref >=4.8)
Large VLDL-P: 2.1 nmol/L (ref <=2.7)
SMALL LDL PARTICLE NUMBER: 252 nmol/L (ref <=527)
Triglycerides: 84 mg/dL (ref 0–149)
VLDL Size: 42.2 nm (ref <=46.6)

## 2014-05-16 ENCOUNTER — Other Ambulatory Visit: Payer: Self-pay | Admitting: Internal Medicine

## 2014-05-26 ENCOUNTER — Encounter: Payer: Self-pay | Admitting: Internal Medicine

## 2014-05-26 MED ORDER — LEVOTHYROXINE SODIUM 150 MCG PO TABS: ORAL_TABLET | ORAL | Status: DC

## 2014-05-26 MED ORDER — LOSARTAN POTASSIUM 100 MG PO TABS: 100.0000 mg | ORAL_TABLET | Freq: Every day | ORAL | Status: DC

## 2014-05-26 MED ORDER — ATORVASTATIN CALCIUM 10 MG PO TABS: ORAL_TABLET | ORAL | Status: DC

## 2015-01-20 ENCOUNTER — Other Ambulatory Visit: Payer: Self-pay

## 2015-01-20 DIAGNOSIS — Z1231 Encounter for screening mammogram for malignant neoplasm of breast: Secondary | ICD-10-CM

## 2015-02-16 ENCOUNTER — Telehealth: Payer: Self-pay | Admitting: Internal Medicine

## 2015-02-16 NOTE — Telephone Encounter (Signed)
Left message asking pt to call office  °

## 2015-02-16 NOTE — Telephone Encounter (Signed)
Pt called stated she is a pt of dr hopper and he recommend you for her PCP.  She said dr hopper talked to you and you agreed to take her as new patient. Is it ok to schedule

## 2015-02-16 NOTE — Telephone Encounter (Signed)
Kaitlin Carter is fine- we talked about that last winter

## 2015-02-16 NOTE — Telephone Encounter (Signed)
Appointment 11/18 Pt aware

## 2015-02-17 ENCOUNTER — Ambulatory Visit
Admission: RE | Admit: 2015-02-17 | Discharge: 2015-02-17 | Disposition: A | Discharge status: Home / Self Care | Payer: PRIVATE HEALTH INSURANCE | Source: Ambulatory Visit

## 2015-02-17 DIAGNOSIS — Z1231 Encounter for screening mammogram for malignant neoplasm of breast: Secondary | ICD-10-CM

## 2015-02-19 ENCOUNTER — Ambulatory Visit: Payer: PRIVATE HEALTH INSURANCE

## 2015-03-13 ENCOUNTER — Encounter: Payer: Self-pay | Admitting: Family Medicine

## 2015-03-13 ENCOUNTER — Ambulatory Visit (INDEPENDENT_AMBULATORY_CARE_PROVIDER_SITE_OTHER): Payer: PRIVATE HEALTH INSURANCE | Admitting: Family Medicine

## 2015-03-13 VITALS — BP 118/70 | HR 86 | Temp 98.1°F | Ht 63.75 in | Wt 186.2 lb

## 2015-03-13 DIAGNOSIS — I10 Essential (primary) hypertension: Secondary | ICD-10-CM

## 2015-03-13 DIAGNOSIS — E785 Hyperlipidemia, unspecified: Secondary | ICD-10-CM

## 2015-03-13 DIAGNOSIS — E559 Vitamin D deficiency, unspecified: Secondary | ICD-10-CM

## 2015-03-13 DIAGNOSIS — E038 Other specified hypothyroidism: Secondary | ICD-10-CM

## 2015-03-13 DIAGNOSIS — E669 Obesity, unspecified: Secondary | ICD-10-CM

## 2015-03-13 MED ORDER — LOSARTAN POTASSIUM 100 MG PO TABS: 100.0000 mg | ORAL_TABLET | Freq: Every day | ORAL | Status: DC

## 2015-03-13 MED ORDER — LEVOTHYROXINE SODIUM 150 MCG PO TABS: ORAL_TABLET | ORAL | Status: DC

## 2015-03-13 NOTE — Assessment & Plan Note (Signed)
Without goiter or hx of thyroid surgery  On generic levothyroxine Refilled Lab and f/u march No clinical changes Lab Results  Component Value Date   TSH 1.17 05/09/2014

## 2015-03-13 NOTE — Progress Notes (Signed)
   Subjective:    Patient ID: Kaitlin Carter, female    DOB: [birth year], 59 y.o.   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  Obese range  Hep C/HIV screen  Flu shot- will get one today   Interested in zoster vaccine when she can get it   colonsc 9/08 -hyperplastic polyps -10 year recall   Mm 10/16 nl - breast center (mother had breast cancer)  Self exam-no lumps   Pap 10/15 with gyn//Carol Vicente Serene Has also seen Dr Nori Riis  Time for her to go   Td 8/09  dexa 1/15 nl bmd Hx of low D Last level 31 in 1/16 - inc her otc D to 2000 iu   bp is stable today  No cp or palpitations or headaches or edema  No side effects to medicines  BP Readings from Last 3 Encounters:  03/13/15 118/70  05/09/14 116/80  05/03/13 126/80     Hx of baseline LBBB Saw cardiology- Dr Aundra Dubin   Hx of factor V Leiden heterozygote status  No clots On asa 81 mg  Sister had a blood clot  Hx of hyperlipidemia Has had lipo science profile Hx of crp elevated  On atorvastatin   Hx of hypothyroidism  No hx of goiter Lab Results  Component Value Date   TSH 1.17 05/09/2014    On generic med for a year -feels the same     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

## 2015-03-13 NOTE — Assessment & Plan Note (Signed)
Last D level in the 30s Pt now on 2000 iu daily Will check in March as planned Last dexa nl Disc imp to bone and overall health

## 2015-03-13 NOTE — Progress Notes (Signed)
Pre visit review using our clinic review tool, if applicable. No additional management support is needed unless otherwise documented below in the visit note. 

## 2015-03-13 NOTE — Assessment & Plan Note (Signed)
Discussed how this problem influences overall health and the risks it imposes  Reviewed plan for weight loss with lower calorie diet (via better food choices and also portion control or program like weight watchers) and exercise building up to or more than 30 minutes 5 days per week including some aerobic activity   She does walk for exercise on a treadmill

## 2015-03-13 NOTE — Patient Instructions (Signed)
Flu shot today  I refilled medicines Schedule annual exam with lab prior approx March   If you are interested in a shingles/zoster vaccine - call your insurance to check on coverage,( you should not get it within 1 month of other vaccines) , then call us for a prescription  for it to take to a pharmacy that gives the shot , or make a nurse visit to get it here depending on your coverage

## 2015-03-13 NOTE — Assessment & Plan Note (Signed)
Pt stopped her atorvastatin for muscle pain  Eating a low sat fat diet  Will check in March as planned and disc further  Disc goals for cholesterol

## 2015-03-13 NOTE — Assessment & Plan Note (Signed)
bp in fair control at this time  BP Readings from Last 1 Encounters:  03/13/15 118/70   No changes needed Disc lifstyle change with low sodium diet and exercise  Refilled losartan F/u spring for annual exam

## 2015-05-26 ENCOUNTER — Encounter: Payer: Self-pay | Admitting: Family Medicine

## 2015-05-26 MED ORDER — LOSARTAN POTASSIUM 100 MG PO TABS: 100.0000 mg | ORAL_TABLET | Freq: Every day | ORAL | Status: DC

## 2015-05-26 MED ORDER — LEVOTHYROXINE SODIUM 150 MCG PO TABS: ORAL_TABLET | ORAL | Status: DC

## 2015-06-08 ENCOUNTER — Ambulatory Visit (INDEPENDENT_AMBULATORY_CARE_PROVIDER_SITE_OTHER): Payer: PRIVATE HEALTH INSURANCE | Admitting: Family Medicine

## 2015-06-08 ENCOUNTER — Encounter: Payer: Self-pay | Admitting: Family Medicine

## 2015-06-08 VITALS — BP 122/70 | HR 85 | Temp 97.8°F | Ht 63.75 in | Wt 192.2 lb

## 2015-06-08 DIAGNOSIS — H6501 Acute serous otitis media, right ear: Secondary | ICD-10-CM

## 2015-06-08 DIAGNOSIS — H6690 Otitis media, unspecified, unspecified ear: Secondary | ICD-10-CM | POA: Insufficient documentation

## 2015-06-08 DIAGNOSIS — J019 Acute sinusitis, unspecified: Secondary | ICD-10-CM | POA: Insufficient documentation

## 2015-06-08 DIAGNOSIS — J0100 Acute maxillary sinusitis, unspecified: Secondary | ICD-10-CM | POA: Diagnosis not present

## 2015-06-08 MED ORDER — AMOXICILLIN-POT CLAVULANATE 875-125 MG PO TABS: 1.0000 | ORAL_TABLET | Freq: Two times a day (BID) | ORAL | Status: DC

## 2015-06-08 NOTE — Assessment & Plan Note (Signed)
R maxillary with early OM -s/p viral uri Disc symptomatic care - see instructions on AVS Cover with augmentin Update if not starting to improve in a week or if worsening
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
## 2015-06-08 NOTE — Patient Instructions (Signed)
Take the augmentin for sinus and ear infection  Drink lots of fluids Rest when you can Breathe steam Get flonase over the counter and use it daily  Use nasal saline whenever  Continue mucinex if helpful  Continue ibuprofen

## 2015-06-08 NOTE — Assessment & Plan Note (Signed)
With acute sinusitis /uri  Cover with augmentin Disc symptomatic care - see instructions on AVS  Update if not starting to improve in a week or if worsening   Will also try otc flonase for sinus inflammation and ETD

## 2015-07-26 LAB — HM PAP SMEAR: HM PAP: NORMAL

## 2015-09-08 ENCOUNTER — Encounter: Payer: Self-pay | Admitting: Family Medicine

## 2015-09-17 ENCOUNTER — Other Ambulatory Visit: Payer: Self-pay | Admitting: Obstetrics & Gynecology

## 2015-09-25 ENCOUNTER — Other Ambulatory Visit: Payer: PRIVATE HEALTH INSURANCE

## 2015-10-05 ENCOUNTER — Ambulatory Visit (INDEPENDENT_AMBULATORY_CARE_PROVIDER_SITE_OTHER): Payer: PRIVATE HEALTH INSURANCE | Admitting: Family Medicine

## 2015-10-05 ENCOUNTER — Encounter: Payer: Self-pay | Admitting: Family Medicine

## 2015-10-05 VITALS — BP 116/68 | HR 59 | Temp 98.0°F | Ht 64.0 in | Wt 185.8 lb

## 2015-10-05 DIAGNOSIS — Z8249 Family history of ischemic heart disease and other diseases of the circulatory system: Secondary | ICD-10-CM

## 2015-10-05 DIAGNOSIS — E559 Vitamin D deficiency, unspecified: Secondary | ICD-10-CM | POA: Diagnosis not present

## 2015-10-05 DIAGNOSIS — E785 Hyperlipidemia, unspecified: Secondary | ICD-10-CM

## 2015-10-05 DIAGNOSIS — Z0000 Encounter for general adult medical examination without abnormal findings: Secondary | ICD-10-CM

## 2015-10-05 DIAGNOSIS — Z114 Encounter for screening for human immunodeficiency virus [HIV]: Secondary | ICD-10-CM | POA: Insufficient documentation

## 2015-10-05 DIAGNOSIS — Z1159 Encounter for screening for other viral diseases: Secondary | ICD-10-CM

## 2015-10-05 DIAGNOSIS — E038 Other specified hypothyroidism: Secondary | ICD-10-CM | POA: Diagnosis not present

## 2015-10-05 DIAGNOSIS — I10 Essential (primary) hypertension: Secondary | ICD-10-CM

## 2015-10-05 DIAGNOSIS — E669 Obesity, unspecified: Secondary | ICD-10-CM

## 2015-10-05 LAB — CBC WITH DIFFERENTIAL/PLATELET
Basophils Absolute: 0 10*3/uL (ref 0.0–0.1)
Basophils Relative: 0.5 % (ref 0.0–3.0)
EOS PCT: 5.4 % — AB (ref 0.0–5.0)
Eosinophils Absolute: 0.5 10*3/uL (ref 0.0–0.7)
HCT: 41.2 % (ref 36.0–46.0)
HEMOGLOBIN: 14 g/dL (ref 12.0–15.0)
LYMPHS ABS: 3.5 10*3/uL (ref 0.7–4.0)
Lymphocytes Relative: 42.4 % (ref 12.0–46.0)
MCHC: 33.9 g/dL (ref 30.0–36.0)
MCV: 92.7 fl (ref 78.0–100.0)
Monocytes Absolute: 0.6 10*3/uL (ref 0.1–1.0)
Monocytes Relative: 7.6 % (ref 3.0–12.0)
Neutro Abs: 3.7 10*3/uL (ref 1.4–7.7)
Neutrophils Relative %: 44.1 % (ref 43.0–77.0)
Platelets: 241 10*3/uL (ref 150.0–400.0)
RBC: 4.45 Mil/uL (ref 3.87–5.11)
RDW: 12.9 % (ref 11.5–15.5)
WBC: 8.3 10*3/uL (ref 4.0–10.5)

## 2015-10-05 LAB — TSH: TSH: 0.77 u[IU]/mL (ref 0.35–4.50)

## 2015-10-05 LAB — COMPREHENSIVE METABOLIC PANEL
ALBUMIN: 4.3 g/dL (ref 3.5–5.2)
ALK PHOS: 51 U/L (ref 39–117)
ALT: 25 U/L (ref 0–35)
AST: 29 U/L (ref 0–37)
BUN: 11 mg/dL (ref 6–23)
CO2: 28 mEq/L (ref 19–32)
Calcium: 9.5 mg/dL (ref 8.4–10.5)
Chloride: 105 mEq/L (ref 96–112)
Creatinine, Ser: 0.72 mg/dL (ref 0.40–1.20)
GFR: 87.98 mL/min (ref >60.00)
Glucose, Bld: 90 mg/dL (ref 70–99)
Potassium: 4 mEq/L (ref 3.5–5.1)
Sodium: 140 mEq/L (ref 135–145)
TOTAL PROTEIN: 7.6 g/dL (ref 6.0–8.3)
Total Bilirubin: 0.5 mg/dL (ref 0.2–1.2)

## 2015-10-05 LAB — LIPID PANEL
Cholesterol: 166 mg/dL (ref 0–200)
HDL: 48.4 mg/dL (ref >39.00)
LDL Cholesterol: 103 mg/dL — ABNORMAL HIGH (ref 0–99)
NonHDL: 117.6
TRIGLYCERIDES: 74 mg/dL (ref 0.0–149.0)
Total CHOL/HDL Ratio: 3
VLDL: 14.8 mg/dL (ref 0.0–40.0)

## 2015-10-05 LAB — VITAMIN D 25 HYDROXY (VIT D DEFICIENCY, FRACTURES): VITD: 58.28 ng/mL (ref 30.00–100.00)

## 2015-10-05 MED ORDER — LEVOTHYROXINE SODIUM 150 MCG PO TABS: ORAL_TABLET | ORAL | Status: DC

## 2015-10-05 MED ORDER — LOSARTAN POTASSIUM 100 MG PO TABS: 100.0000 mg | ORAL_TABLET | Freq: Every day | ORAL | Status: DC

## 2015-10-05 NOTE — Assessment & Plan Note (Signed)
Nl dexa 1/15 No falls or fx  Level was 31 prev Repeat today  On daily D3

## 2015-10-05 NOTE — Assessment & Plan Note (Signed)
Disc goals for lipids and reasons to control them Rev labs with pt (past) Took statin in past -see overview Rev low sat fat diet in detail  Low threshold to treat given strong family hx  Rev her NMR from 2005

## 2015-10-05 NOTE — Assessment & Plan Note (Signed)
Discussed how this problem influences overall health and the risks it imposes  Reviewed plan for weight loss with lower calorie diet (via better food choices and also portion control or program like weight watchers) and exercise building up to or more than 30 minutes 5 days per week including some aerobic activity   She will work on portion control

## 2015-10-05 NOTE — Assessment & Plan Note (Signed)
bp in fair control at this time  BP Readings from Last 1 Encounters:  10/05/15 116/68   No changes needed Disc lifstyle change with low sodium diet and exercise   Labs today  Enc wt loss

## 2015-10-05 NOTE — Patient Instructions (Signed)
EKG today  Labs today  Take care of yourself  Try to work up to exercise 5 days per week for 30 minutes or more  Eat a healthy diet  (Avoid red meat/ fried foods/ egg yolks/ fatty breakfast meats/ butter, cheese and high fat dairy/ and shellfish)
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
## 2015-10-05 NOTE — Assessment & Plan Note (Signed)
TSH today  No clinical changes  

## 2015-10-05 NOTE — Assessment & Plan Note (Signed)
Reviewed health habits including diet and exercise and skin cancer prevention Reviewed appropriate screening tests for age  Also reviewed health mt list, fam hx and immunization status , as well as social and family history   See HPI Labs today Also screen for Hep C and HIV  EKG today  Labs today  Take care of yourself  Try to work up to exercise 5 days per week for 30 minutes or more  Eat a healthy diet  (Avoid red meat/ fried foods/ egg yolks/ fatty breakfast meats/ butter, cheese and high fat dairy/ and shellfish)

## 2015-10-05 NOTE — Assessment & Plan Note (Signed)
EKG today-no changes  No symptoms  Will continue to watch closely  Low threshold to tx cholesterol depending on result

## 2015-10-05 NOTE — Progress Notes (Signed)
Pre visit review using our clinic review tool, if applicable. No additional management support is needed unless otherwise documented below in the visit note. 

## 2015-10-06 ENCOUNTER — Encounter: Payer: Self-pay | Admitting: Family Medicine

## 2015-10-06 LAB — HEPATITIS C ANTIBODY: HCV Ab: NEGATIVE

## 2015-10-06 LAB — HIV ANTIBODY (ROUTINE TESTING W REFLEX): HIV 1&2 Ab, 4th Generation: NONREACTIVE

## 2016-03-02 ENCOUNTER — Other Ambulatory Visit: Payer: Self-pay | Admitting: Obstetrics & Gynecology

## 2016-03-02 DIAGNOSIS — Z1231 Encounter for screening mammogram for malignant neoplasm of breast: Secondary | ICD-10-CM

## 2016-04-08 ENCOUNTER — Ambulatory Visit
Admission: RE | Admit: 2016-04-08 | Discharge: 2016-04-08 | Disposition: A | Discharge status: Home / Self Care | Payer: PRIVATE HEALTH INSURANCE | Source: Ambulatory Visit | Attending: Obstetrics & Gynecology | Admitting: Obstetrics & Gynecology

## 2016-04-08 DIAGNOSIS — Z1231 Encounter for screening mammogram for malignant neoplasm of breast: Secondary | ICD-10-CM

## 2016-08-24 LAB — HM PAP SMEAR: HM Pap smear: NORMAL

## 2016-10-07 ENCOUNTER — Encounter: Payer: PRIVATE HEALTH INSURANCE | Admitting: Family Medicine

## 2016-10-14 ENCOUNTER — Encounter: Payer: Self-pay | Admitting: Family Medicine

## 2016-10-14 ENCOUNTER — Encounter: Payer: PRIVATE HEALTH INSURANCE | Admitting: Family Medicine

## 2016-10-14 ENCOUNTER — Ambulatory Visit (INDEPENDENT_AMBULATORY_CARE_PROVIDER_SITE_OTHER): Payer: PRIVATE HEALTH INSURANCE | Admitting: Family Medicine

## 2016-10-14 VITALS — BP 128/74 | HR 72 | Temp 98.3°F | Ht 64.0 in | Wt 186.5 lb

## 2016-10-14 DIAGNOSIS — E6609 Other obesity due to excess calories: Secondary | ICD-10-CM | POA: Diagnosis not present

## 2016-10-14 DIAGNOSIS — E7800 Pure hypercholesterolemia, unspecified: Secondary | ICD-10-CM

## 2016-10-14 DIAGNOSIS — E038 Other specified hypothyroidism: Secondary | ICD-10-CM | POA: Diagnosis not present

## 2016-10-14 DIAGNOSIS — Z6832 Body mass index (BMI) 32.0-32.9, adult: Secondary | ICD-10-CM | POA: Diagnosis not present

## 2016-10-14 DIAGNOSIS — E559 Vitamin D deficiency, unspecified: Secondary | ICD-10-CM

## 2016-10-14 DIAGNOSIS — Z0000 Encounter for general adult medical examination without abnormal findings: Secondary | ICD-10-CM | POA: Diagnosis not present

## 2016-10-14 DIAGNOSIS — I10 Essential (primary) hypertension: Secondary | ICD-10-CM

## 2016-10-14 LAB — CBC WITH DIFFERENTIAL/PLATELET
BASOS ABS: 0.1 10*3/uL (ref 0.0–0.1)
BASOS PCT: 0.6 % (ref 0.0–3.0)
Eosinophils Absolute: 0.5 10*3/uL (ref 0.0–0.7)
Eosinophils Relative: 4.6 % (ref 0.0–5.0)
HCT: 41.6 % (ref 36.0–46.0)
Hemoglobin: 14.1 g/dL (ref 12.0–15.0)
Lymphocytes Relative: 38.3 % (ref 12.0–46.0)
Lymphs Abs: 3.8 10*3/uL (ref 0.7–4.0)
MCHC: 33.8 g/dL (ref 30.0–36.0)
MCV: 92.5 fl (ref 78.0–100.0)
MONO ABS: 0.9 10*3/uL (ref 0.1–1.0)
Monocytes Relative: 8.6 % (ref 3.0–12.0)
NEUTROS ABS: 4.8 10*3/uL (ref 1.4–7.7)
NEUTROS PCT: 47.9 % (ref 43.0–77.0)
PLATELETS: 271 10*3/uL (ref 150.0–400.0)
RBC: 4.5 Mil/uL (ref 3.87–5.11)
RDW: 12.7 % (ref 11.5–15.5)
WBC: 9.9 10*3/uL (ref 4.0–10.5)

## 2016-10-14 LAB — COMPREHENSIVE METABOLIC PANEL
ALT: 25 U/L (ref 0–35)
AST: 27 U/L (ref 0–37)
Albumin: 4.4 g/dL (ref 3.5–5.2)
Alkaline Phosphatase: 55 U/L (ref 39–117)
BUN: 9 mg/dL (ref 6–23)
CHLORIDE: 102 meq/L (ref 96–112)
CO2: 29 meq/L (ref 19–32)
CREATININE: 0.74 mg/dL (ref 0.40–1.20)
Calcium: 9.6 mg/dL (ref 8.4–10.5)
GFR: 84.95 mL/min (ref >60.00)
GLUCOSE: 93 mg/dL (ref 70–99)
Potassium: 3.8 mEq/L (ref 3.5–5.1)
SODIUM: 139 meq/L (ref 135–145)
Total Bilirubin: 0.5 mg/dL (ref 0.2–1.2)
Total Protein: 7.5 g/dL (ref 6.0–8.3)

## 2016-10-14 LAB — LIPID PANEL
CHOL/HDL RATIO: 3
Cholesterol: 152 mg/dL (ref 0–200)
HDL: 49.4 mg/dL (ref >39.00)
LDL CALC: 86 mg/dL (ref 0–99)
NONHDL: 102.58
TRIGLYCERIDES: 84 mg/dL (ref 0.0–149.0)
VLDL: 16.8 mg/dL (ref 0.0–40.0)

## 2016-10-14 LAB — TSH: TSH: 1.17 u[IU]/mL (ref 0.35–4.50)

## 2016-10-14 LAB — VITAMIN D 25 HYDROXY (VIT D DEFICIENCY, FRACTURES): VITD: 50.57 ng/mL (ref 30.00–100.00)

## 2016-10-14 MED ORDER — LOSARTAN POTASSIUM 100 MG PO TABS: 100.0000 mg | ORAL_TABLET | Freq: Every day | ORAL | 3 refills | Status: DC

## 2016-10-14 NOTE — Assessment & Plan Note (Signed)
Reviewed health habits including diet and exercise and skin cancer prevention Reviewed appropriate screening tests for age  Also reviewed health mt list, fam hx and immunization status , as well as social and family history   See HPI Wellness labs and D level today  Due for colonsocpy 9/18- she will schedule that  Interested in Shingrix when it is avail if affordable Disc caregiver stress and self care

## 2016-10-14 NOTE — Assessment & Plan Note (Signed)
Taking 2000 iu daily Lab today  Rev nl dexa 2015

## 2016-10-14 NOTE — Assessment & Plan Note (Signed)
Discussed how this problem influences overall health and the risks it imposes  Reviewed plan for weight loss with lower calorie diet (via better food choices and also portion control or program like weight watchers) and exercise building up to or more than 30 minutes 5 days per week including some aerobic activity    

## 2016-10-14 NOTE — Assessment & Plan Note (Signed)
Due for labs -TSH today  She takes 150/75 qod Some fatigue and hair loss (could also be from stress) Hold off on refill to drug source until lab returns
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
## 2016-10-14 NOTE — Assessment & Plan Note (Signed)
Lab today  fam hx of CAD Intol to atorvastatin 5 mg in the past Good diet  Rev last lipids

## 2016-10-14 NOTE — Assessment & Plan Note (Signed)
bp in fair control at this time  BP Readings from Last 1 Encounters:  10/14/16 128/74   No changes needed Disc lifstyle change with low sodium diet and exercise  On losartan -will refill

## 2016-10-14 NOTE — Patient Instructions (Addendum)
There is a book called the 36 Hour Day - look for it on [employer] or at a bookstore   (about dementia care)  Get as much help with your mom as you can to give some time for self care   Don't forget to make your colonoscopy appt for fall (sept) and let us know if you need a referral   Then new shingles vaccine is called Shingrix  It is on back order/not available now  See if your insurance cover it and we will consider it later   Labs today

## 2016-11-18 ENCOUNTER — Encounter: Payer: Self-pay | Admitting: Family Medicine

## 2016-11-18 MED ORDER — LOSARTAN POTASSIUM 100 MG PO TABS: 100.0000 mg | ORAL_TABLET | Freq: Every day | ORAL | 2 refills | Status: DC

## 2016-11-18 MED ORDER — LEVOTHYROXINE SODIUM 150 MCG PO TABS: ORAL_TABLET | ORAL | 2 refills | Status: DC

## 2017-01-18 ENCOUNTER — Encounter: Payer: Self-pay | Admitting: Gastroenterology

## 2017-02-13 ENCOUNTER — Ambulatory Visit (AMBULATORY_SURGERY_CENTER): Payer: Self-pay | Admitting: *Deleted

## 2017-02-13 VITALS — Ht 65.5 in | Wt 190.0 lb

## 2017-02-13 DIAGNOSIS — Z1211 Encounter for screening for malignant neoplasm of colon: Secondary | ICD-10-CM

## 2017-02-13 MED ORDER — NA SULFATE-K SULFATE-MG SULF 17.5-3.13-1.6 GM/177ML PO SOLN: 1.0000 [IU] | Freq: Once | ORAL | 0 refills | Status: AC

## 2017-02-13 NOTE — Progress Notes (Signed)
No egg or soy allergy known to patient   issues with past sedation with any surgeries  or procedures, no intubation problems  A lot of nausea and vomiting with general anesthesia  No trouble with sedation 10 years ago with colonoscopy No diet pills per patient No home 02 use per patient  No blood thinners per patient  Pt denies issues with constipation  No A fib or A flutter  EMMI video sent to pt's e mail Pt. declined

## 2017-02-27 ENCOUNTER — Encounter: Payer: Self-pay | Admitting: Gastroenterology

## 2017-02-27 ENCOUNTER — Ambulatory Visit (AMBULATORY_SURGERY_CENTER): Payer: PRIVATE HEALTH INSURANCE | Admitting: Gastroenterology

## 2017-02-27 VITALS — BP 119/71 | HR 72 | Temp 97.8°F | Resp 16 | Ht 65.0 in | Wt 190.0 lb

## 2017-02-27 DIAGNOSIS — Z1211 Encounter for screening for malignant neoplasm of colon: Secondary | ICD-10-CM

## 2017-02-27 DIAGNOSIS — D122 Benign neoplasm of ascending colon: Secondary | ICD-10-CM | POA: Diagnosis not present

## 2017-02-27 DIAGNOSIS — Z1212 Encounter for screening for malignant neoplasm of rectum: Secondary | ICD-10-CM | POA: Diagnosis not present

## 2017-02-27 MED ORDER — SODIUM CHLORIDE 0.9 % IV SOLN: 500.0000 mL | INTRAVENOUS | Status: DC

## 2017-02-27 NOTE — Progress Notes (Signed)
Pt's states no medical or surgical changes since previsit or office visit. 

## 2017-02-27 NOTE — Op Note (Signed)
[facility] Patient Name: Kaitlin Carter Procedure Date: 02/27/2017 1:36 PM MRN: [id number] Endoscopist: Milus Banister , MD Age: 61 Referring MD:  Date of Birth: 12/31/1955 Gender: Female Account #: [id number] Procedure:                Colonoscopy Indications:              Screening for colorectal malignant neoplasm; 2008                            colonoscopy hyperplastic polyps only Medicines:                Monitored Anesthesia Care Procedure:                Pre-Anesthesia Assessment:                           - Prior to the procedure, a History and Physical                            was performed, and patient medications and                            allergies were reviewed. The patient's tolerance of                            previous anesthesia was also reviewed. The risks                            and benefits of the procedure and the sedation                            options and risks were discussed with the patient.                            All questions were answered, and informed consent                            was obtained. Prior Anticoagulants: The patient has                            taken no previous anticoagulant or antiplatelet                            agents. ASA Grade Assessment: II - A patient with                            mild systemic disease. After reviewing the risks                            and benefits, the patient was deemed in                            satisfactory condition to undergo the procedure.  After obtaining informed consent, the colonoscope                            was passed under direct vision. Throughout the                            procedure, the patient's blood pressure, pulse, and                            oxygen saturations were monitored continuously. The                            Colonoscope was introduced through the anus and                            advanced to the the  cecum, identified by                            appendiceal orifice and ileocecal valve. The                            colonoscopy was performed without difficulty. The                            patient tolerated the procedure well. The quality                            of the bowel preparation was excellent. The                            ileocecal valve, appendiceal orifice, and rectum                            were photographed. Scope In: 1:45:51 PM Scope Out: 1:58:34 PM Scope Withdrawal Time: 0 hours 11 minutes 6 seconds  Total Procedure Duration: 0 hours 12 minutes 43 seconds  Findings:                 Two sessile polyps were found in the ascending                            colon. The polyps were 4 to 6 mm in size. These                            polyps were removed with a cold snare. Resection                            and retrieval were complete.                           The exam was otherwise without abnormality on                            direct and retroflexion views. Complications:  No immediate complications. Estimated blood loss:                            None. Estimated Blood Loss:     Estimated blood loss: none. Impression:               - Two 4 to 6 mm polyps in the ascending colon,                            removed with a cold snare. Resected and retrieved.                           - The examination was otherwise normal on direct                            and retroflexion views. Recommendation:           - Patient has a contact number available for                            emergencies. The signs and symptoms of potential                            delayed complications were discussed with the                            patient. Return to normal activities tomorrow.                            Written discharge instructions were provided to the                            patient.                           - Resume previous diet.                            - Continue present medications.                           You will receive a letter within 2-3 weeks with the                            pathology results and my final recommendations.                           If the polyp(s) is proven to be 'pre-cancerous' on                            pathology, you will need repeat colonoscopy in 5                            years. If the polyp(s) is NOT 'precancerous' on  pathology then you should repeat colon cancer                            screening in 10 years with colonoscopy without need                            for colon cancer screening by any method prior to                            then (including stool testing). Milus Banister, MD 02/27/2017 2:01:29 PM This report has been signed electronically.

## 2017-02-27 NOTE — Progress Notes (Signed)
Report to PACU, RN, vss, BBS= Clear.  

## 2017-02-27 NOTE — Patient Instructions (Signed)
YOU HAD AN ENDOSCOPIC PROCEDURE TODAY AT THE [facility]:   Refer to the procedure report that was given to you for any specific questions about what was found during the examination.  If the procedure report does not answer your questions, please call your gastroenterologist to clarify.  If you requested that your care partner not be given the details of your procedure findings, then the procedure report has been included in a sealed envelope for you to review at your convenience later.  YOU SHOULD EXPECT: Some feelings of bloating in the abdomen. Passage of more gas than usual.  Walking can help get rid of the air that was put into your GI tract during the procedure and reduce the bloating. If you had a lower endoscopy (such as a colonoscopy or flexible sigmoidoscopy) you may notice spotting of blood in your stool or on the toilet paper. If you underwent a bowel prep for your procedure, you may not have a normal bowel movement for a few days.  Please Note:  You might notice some irritation and congestion in your nose or some drainage.  This is from the oxygen used during your procedure.  There is no need for concern and it should clear up in a day or so.  SYMPTOMS TO REPORT IMMEDIATELY:   Following lower endoscopy (colonoscopy or flexible sigmoidoscopy):  Excessive amounts of blood in the stool  Significant tenderness or worsening of abdominal pains  Swelling of the abdomen that is new, acute  Fever of 100F or higher  For urgent or emergent issues, a gastroenterologist can be reached at any hour by calling [phone].   DIET:  We do recommend a small meal at first, but then you may proceed to your regular diet.  Drink plenty of fluids but you should avoid alcoholic beverages for 24 hours.  ACTIVITY:  You should plan to take it easy for the rest of today and you should NOT DRIVE or use heavy machinery until tomorrow (because of the sedation medicines used during the test).     FOLLOW UP: Our staff will call the number listed on your records the next business day following your procedure to check on you and address any questions or concerns that you may have regarding the information given to you following your procedure. If we do not reach you, we will leave a message.  However, if you are feeling well and you are not experiencing any problems, there is no need to return our call.  We will assume that you have returned to your regular daily activities without incident.  If any biopsies were taken you will be contacted by phone or by letter within the next 1-3 weeks.  Please call us at [phone] if you have not heard about the biopsies in 3 weeks.   Await for biopsy results to determine next repeat Colonoscopy screening Polyps (handout given)  SIGNATURES/CONFIDENTIALITY: You and/or your care partner have signed paperwork which will be entered into your electronic medical record.  These signatures attest to the fact that that the information above on your After Visit Summary has been reviewed and is understood.  Full responsibility of the confidentiality of this discharge information lies with you and/or your care-partner. 

## 2017-02-27 NOTE — Progress Notes (Signed)
Called to room to assist during endoscopic procedure.  Patient ID and intended procedure confirmed with present staff. Received instructions for my participation in the procedure from the performing physician.  

## 2017-02-28 ENCOUNTER — Telehealth: Payer: Self-pay | Admitting: *Deleted

## 2017-02-28 NOTE — Telephone Encounter (Signed)
  Follow up Call-  Call back number 02/27/2017  Post procedure Call Back phone  # [phone]  Permission to leave phone message Yes  Some recent data might be hidden     Patient questions:  Do you have a fever, pain , or abdominal swelling? No. Pain Score  0 *  Have you tolerated food without any problems? Yes.    Have you been able to return to your normal activities? Yes.    Do you have any questions about your discharge instructions: Diet   No. Medications  No. Follow up visit  No.  Do you have questions or concerns about your Care? No.  Actions: * If pain score is 4 or above: No action needed, pain <4.

## 2017-03-03 ENCOUNTER — Encounter: Payer: Self-pay | Admitting: Gastroenterology

## 2017-03-06 ENCOUNTER — Other Ambulatory Visit: Payer: Self-pay | Admitting: Obstetrics & Gynecology

## 2017-03-06 DIAGNOSIS — Z1231 Encounter for screening mammogram for malignant neoplasm of breast: Secondary | ICD-10-CM

## 2017-04-21 ENCOUNTER — Ambulatory Visit: Payer: PRIVATE HEALTH INSURANCE

## 2017-06-02 ENCOUNTER — Ambulatory Visit
Admission: RE | Admit: 2017-06-02 | Discharge: 2017-06-02 | Disposition: A | Discharge status: Home / Self Care | Payer: BLUE CROSS/BLUE SHIELD | Source: Ambulatory Visit | Attending: Obstetrics & Gynecology | Admitting: Obstetrics & Gynecology

## 2017-06-02 DIAGNOSIS — Z1231 Encounter for screening mammogram for malignant neoplasm of breast: Secondary | ICD-10-CM

## 2017-10-09 ENCOUNTER — Encounter: Payer: Self-pay | Admitting: Family Medicine

## 2017-10-09 MED ORDER — LOSARTAN POTASSIUM 100 MG PO TABS: 100.0000 mg | ORAL_TABLET | Freq: Every day | ORAL | 0 refills | Status: DC

## 2017-10-20 ENCOUNTER — Encounter: Payer: PRIVATE HEALTH INSURANCE | Admitting: Family Medicine

## 2017-11-06 ENCOUNTER — Other Ambulatory Visit: Payer: Self-pay | Admitting: *Deleted

## 2017-11-06 MED ORDER — LOSARTAN POTASSIUM 100 MG PO TABS: 100.0000 mg | ORAL_TABLET | Freq: Every day | ORAL | 1 refills | Status: DC

## 2017-12-22 ENCOUNTER — Ambulatory Visit (INDEPENDENT_AMBULATORY_CARE_PROVIDER_SITE_OTHER): Payer: BLUE CROSS/BLUE SHIELD | Admitting: Family Medicine

## 2017-12-22 ENCOUNTER — Encounter: Payer: Self-pay | Admitting: Family Medicine

## 2017-12-22 VITALS — BP 122/70 | HR 63 | Temp 98.4°F | Ht 63.75 in | Wt 187.0 lb

## 2017-12-22 DIAGNOSIS — E559 Vitamin D deficiency, unspecified: Secondary | ICD-10-CM

## 2017-12-22 DIAGNOSIS — Z0000 Encounter for general adult medical examination without abnormal findings: Secondary | ICD-10-CM

## 2017-12-22 DIAGNOSIS — E6609 Other obesity due to excess calories: Secondary | ICD-10-CM

## 2017-12-22 DIAGNOSIS — Z86010 Personal history of colonic polyps: Secondary | ICD-10-CM

## 2017-12-22 DIAGNOSIS — Z23 Encounter for immunization: Secondary | ICD-10-CM

## 2017-12-22 DIAGNOSIS — E7800 Pure hypercholesterolemia, unspecified: Secondary | ICD-10-CM | POA: Diagnosis not present

## 2017-12-22 DIAGNOSIS — I10 Essential (primary) hypertension: Secondary | ICD-10-CM | POA: Diagnosis not present

## 2017-12-22 DIAGNOSIS — Z6832 Body mass index (BMI) 32.0-32.9, adult: Secondary | ICD-10-CM

## 2017-12-22 DIAGNOSIS — E039 Hypothyroidism, unspecified: Secondary | ICD-10-CM | POA: Diagnosis not present

## 2017-12-22 LAB — CBC WITH DIFFERENTIAL/PLATELET
BASOS PCT: 0.6 % (ref 0.0–3.0)
Basophils Absolute: 0 10*3/uL (ref 0.0–0.1)
Eosinophils Absolute: 0.4 10*3/uL (ref 0.0–0.7)
Eosinophils Relative: 5.7 % — ABNORMAL HIGH (ref 0.0–5.0)
HEMATOCRIT: 41.4 % (ref 36.0–46.0)
HEMOGLOBIN: 14 g/dL (ref 12.0–15.0)
LYMPHS PCT: 42.2 % (ref 12.0–46.0)
Lymphs Abs: 3.2 10*3/uL (ref 0.7–4.0)
MCHC: 33.9 g/dL (ref 30.0–36.0)
MCV: 92.3 fl (ref 78.0–100.0)
MONOS PCT: 7.5 % (ref 3.0–12.0)
Monocytes Absolute: 0.6 10*3/uL (ref 0.1–1.0)
Neutro Abs: 3.4 10*3/uL (ref 1.4–7.7)
Neutrophils Relative %: 44 % (ref 43.0–77.0)
Platelets: 222 10*3/uL (ref 150.0–400.0)
RBC: 4.49 Mil/uL (ref 3.87–5.11)
RDW: 12.5 % (ref 11.5–15.5)
WBC: 7.6 10*3/uL (ref 4.0–10.5)

## 2017-12-22 LAB — COMPREHENSIVE METABOLIC PANEL
ALBUMIN: 4.4 g/dL (ref 3.5–5.2)
ALT: 20 U/L (ref 0–35)
AST: 24 U/L (ref 0–37)
Alkaline Phosphatase: 52 U/L (ref 39–117)
BILIRUBIN TOTAL: 0.7 mg/dL (ref 0.2–1.2)
BUN: 13 mg/dL (ref 6–23)
CALCIUM: 9.6 mg/dL (ref 8.4–10.5)
CHLORIDE: 103 meq/L (ref 96–112)
CO2: 31 meq/L (ref 19–32)
CREATININE: 0.79 mg/dL (ref 0.40–1.20)
GFR: 78.46 mL/min (ref >60.00)
Glucose, Bld: 92 mg/dL (ref 70–99)
Potassium: 4.4 mEq/L (ref 3.5–5.1)
Sodium: 140 mEq/L (ref 135–145)
Total Protein: 7.8 g/dL (ref 6.0–8.3)

## 2017-12-22 LAB — LIPID PANEL
CHOL/HDL RATIO: 3
CHOLESTEROL: 149 mg/dL (ref 0–200)
HDL: 48.9 mg/dL (ref >39.00)
LDL CALC: 82 mg/dL (ref 0–99)
NonHDL: 100.01
Triglycerides: 89 mg/dL (ref 0.0–149.0)
VLDL: 17.8 mg/dL (ref 0.0–40.0)

## 2017-12-22 LAB — TSH: TSH: 1.78 u[IU]/mL (ref 0.35–4.50)

## 2017-12-22 LAB — VITAMIN D 25 HYDROXY (VIT D DEFICIENCY, FRACTURES): VITD: 47.81 ng/mL (ref 30.00–100.00)

## 2017-12-22 MED ORDER — LOSARTAN POTASSIUM 100 MG PO TABS: 100.0000 mg | ORAL_TABLET | Freq: Every day | ORAL | 3 refills | Status: DC

## 2017-12-22 MED ORDER — LEVOTHYROXINE SODIUM 150 MCG PO TABS: ORAL_TABLET | ORAL | 3 refills | Status: DC

## 2017-12-22 NOTE — Assessment & Plan Note (Signed)
Reviewed health habits including diet and exercise and skin cancer prevention Reviewed appropriate screening tests for age  Also reviewed health mt list, fam hx and immunization status , as well as social and family history   See HPI Labs ordered  Tdap vaccine today  Will get flu shot in the fall Enc her to get on pharmacy wait list for shingrix  Enc good self care and vit D for bone health

## 2017-12-22 NOTE — Assessment & Plan Note (Signed)
Colonoscopy 11/18 with polyp- 5 y recall

## 2017-12-22 NOTE — Patient Instructions (Addendum)
Tdap vaccine today   Get your flu shot in the fall   If you are interested in the new shingles vaccine (Shingrix) - call your local pharmacy to check on coverage and availability  If affordable - get on a waiting list    Keep taking care of yourself  Add some stretching or yoga to your exercise routine
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
## 2017-12-22 NOTE — Assessment & Plan Note (Signed)
bp in fair control at this time  BP Readings from Last 1 Encounters:  12/22/17 122/70   No changes needed Most recent labs reviewed  Disc lifstyle change with low sodium diet and exercise

## 2017-12-22 NOTE — Assessment & Plan Note (Signed)
Strong family hx of CAD in the past  She took atorvastatin in the past after cardiac w/u and elevated CRP Also had lipo profile in the past  Labs today=pending result

## 2017-12-22 NOTE — Assessment & Plan Note (Signed)
D level today  No falls/fx

## 2017-12-22 NOTE — Assessment & Plan Note (Addendum)
No clinical changes TSH drawn today  Taking levothyroxine 150 mcg (alt 1/2 and whole pill)

## 2017-12-22 NOTE — Assessment & Plan Note (Signed)
Discussed how this problem influences overall health and the risks it imposes  Reviewed plan for weight loss with lower calorie diet (via better food choices and also portion control or program like weight watchers) and exercise building up to or more than 30 minutes 5 days per week including some aerobic activity    

## 2018-09-18 ENCOUNTER — Other Ambulatory Visit: Payer: Self-pay | Admitting: Obstetrics & Gynecology

## 2018-09-18 DIAGNOSIS — Z1231 Encounter for screening mammogram for malignant neoplasm of breast: Secondary | ICD-10-CM

## 2018-10-12 ENCOUNTER — Other Ambulatory Visit: Payer: Self-pay | Admitting: Family Medicine

## 2018-11-08 ENCOUNTER — Ambulatory Visit
Admission: RE | Admit: 2018-11-08 | Discharge: 2018-11-08 | Disposition: A | Discharge status: Home / Self Care | Payer: BC Managed Care – PPO | Source: Ambulatory Visit | Attending: Obstetrics & Gynecology | Admitting: Obstetrics & Gynecology

## 2018-11-08 ENCOUNTER — Other Ambulatory Visit: Payer: Self-pay

## 2018-11-08 DIAGNOSIS — Z1231 Encounter for screening mammogram for malignant neoplasm of breast: Secondary | ICD-10-CM

## 2018-12-07 ENCOUNTER — Other Ambulatory Visit: Payer: Self-pay | Admitting: Family Medicine

## 2019-01-04 ENCOUNTER — Ambulatory Visit (INDEPENDENT_AMBULATORY_CARE_PROVIDER_SITE_OTHER): Payer: BC Managed Care – PPO | Admitting: Family Medicine

## 2019-01-04 ENCOUNTER — Other Ambulatory Visit: Payer: Self-pay

## 2019-01-04 ENCOUNTER — Encounter: Payer: Self-pay | Admitting: Family Medicine

## 2019-01-04 VITALS — BP 124/66 | HR 61 | Temp 97.0°F | Ht 64.0 in | Wt 185.1 lb

## 2019-01-04 DIAGNOSIS — E039 Hypothyroidism, unspecified: Secondary | ICD-10-CM | POA: Diagnosis not present

## 2019-01-04 DIAGNOSIS — E6609 Other obesity due to excess calories: Secondary | ICD-10-CM

## 2019-01-04 DIAGNOSIS — E7800 Pure hypercholesterolemia, unspecified: Secondary | ICD-10-CM

## 2019-01-04 DIAGNOSIS — Z6831 Body mass index (BMI) 31.0-31.9, adult: Secondary | ICD-10-CM

## 2019-01-04 DIAGNOSIS — E559 Vitamin D deficiency, unspecified: Secondary | ICD-10-CM

## 2019-01-04 DIAGNOSIS — Z0000 Encounter for general adult medical examination without abnormal findings: Secondary | ICD-10-CM

## 2019-01-04 DIAGNOSIS — I10 Essential (primary) hypertension: Secondary | ICD-10-CM

## 2019-01-04 LAB — LIPID PANEL
Cholesterol: 145 mg/dL (ref 0–200)
HDL: 46 mg/dL (ref >39.00)
LDL Cholesterol: 83 mg/dL (ref 0–99)
NonHDL: 99.41
Total CHOL/HDL Ratio: 3
Triglycerides: 84 mg/dL (ref 0.0–149.0)
VLDL: 16.8 mg/dL (ref 0.0–40.0)

## 2019-01-04 LAB — CBC WITH DIFFERENTIAL/PLATELET
Basophils Absolute: 0.1 10*3/uL (ref 0.0–0.1)
Basophils Relative: 1 % (ref 0.0–3.0)
Eosinophils Absolute: 0.6 10*3/uL (ref 0.0–0.7)
Eosinophils Relative: 7.9 % — ABNORMAL HIGH (ref 0.0–5.0)
HCT: 41.6 % (ref 36.0–46.0)
Hemoglobin: 14.2 g/dL (ref 12.0–15.0)
Lymphocytes Relative: 46.3 % — ABNORMAL HIGH (ref 12.0–46.0)
Lymphs Abs: 3.3 10*3/uL (ref 0.7–4.0)
MCHC: 34 g/dL (ref 30.0–36.0)
MCV: 93.6 fl (ref 78.0–100.0)
Monocytes Absolute: 0.6 10*3/uL (ref 0.1–1.0)
Monocytes Relative: 8.8 % (ref 3.0–12.0)
Neutro Abs: 2.5 10*3/uL (ref 1.4–7.7)
Neutrophils Relative %: 36 % — ABNORMAL LOW (ref 43.0–77.0)
Platelets: 263 10*3/uL (ref 150.0–400.0)
RBC: 4.45 Mil/uL (ref 3.87–5.11)
RDW: 12.6 % (ref 11.5–15.5)
WBC: 7.1 10*3/uL (ref 4.0–10.5)

## 2019-01-04 LAB — COMPREHENSIVE METABOLIC PANEL
ALT: 20 U/L (ref 0–35)
AST: 23 U/L (ref 0–37)
Albumin: 4.2 g/dL (ref 3.5–5.2)
Alkaline Phosphatase: 52 U/L (ref 39–117)
BUN: 11 mg/dL (ref 6–23)
CO2: 30 mEq/L (ref 19–32)
Calcium: 9.6 mg/dL (ref 8.4–10.5)
Chloride: 103 mEq/L (ref 96–112)
Creatinine, Ser: 0.72 mg/dL (ref 0.40–1.20)
GFR: 81.89 mL/min (ref >60.00)
Glucose, Bld: 92 mg/dL (ref 70–99)
Potassium: 4.1 mEq/L (ref 3.5–5.1)
Sodium: 140 mEq/L (ref 135–145)
Total Bilirubin: 0.5 mg/dL (ref 0.2–1.2)
Total Protein: 7.4 g/dL (ref 6.0–8.3)

## 2019-01-04 LAB — VITAMIN D 25 HYDROXY (VIT D DEFICIENCY, FRACTURES): VITD: 41.85 ng/mL (ref 30.00–100.00)

## 2019-01-04 LAB — TSH: TSH: 1.08 u[IU]/mL (ref 0.35–4.50)
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
## 2019-01-04 NOTE — Patient Instructions (Signed)
Take care of yourself  Keep walking  Wear sun protection   Labs today   Follow up with gyn   Get your flu shot at work

## 2019-01-05 NOTE — Assessment & Plan Note (Signed)
Level today Disc importance to bone and overall health

## 2019-01-05 NOTE — Assessment & Plan Note (Signed)
bp in fair control at this time  BP Readings from Last 1 Encounters:  01/04/19 124/66   No changes needed Most recent labs reviewed  Disc lifstyle change with low sodium diet and exercise

## 2019-01-05 NOTE — Assessment & Plan Note (Signed)
Due for labs Last LDL was 82 Good diet  Disc goals for lipids and reasons to control them Rev last labs with pt Rev low sat fat diet in detail

## 2019-01-05 NOTE — Assessment & Plan Note (Signed)
Discussed how this problem influences overall health and the risks it imposes  Reviewed plan for weight loss with lower calorie diet (via better food choices and also portion control or program like weight watchers) and exercise building up to or more than 30 minutes 5 days per week including some aerobic activity    

## 2019-01-05 NOTE — Assessment & Plan Note (Signed)
Reviewed health habits including diet and exercise and skin cancer prevention Reviewed appropriate screening tests for age  Also reviewed health mt list, fam hx and immunization status , as well as social and family history   See HPI Labs ordered  Enc ca and D intake as well as exercise  Pt will get flu vaccine at work in early oct  Has had shingrix  Plans to f/u with gyn in oct

## 2019-01-05 NOTE — Assessment & Plan Note (Signed)
Hypothyroidism  Pt has no clinical changes No change in energy level/ hair or skin/ edema and no tremor Lab Results  Component Value Date   TSH 1.08 01/04/2019

## 2019-02-03 ENCOUNTER — Other Ambulatory Visit: Payer: Self-pay | Admitting: Family Medicine

## 2019-04-02 ENCOUNTER — Other Ambulatory Visit: Payer: Self-pay | Admitting: Family Medicine

## 2019-10-30 ENCOUNTER — Other Ambulatory Visit: Payer: Self-pay | Admitting: Obstetrics & Gynecology

## 2019-10-30 DIAGNOSIS — Z1231 Encounter for screening mammogram for malignant neoplasm of breast: Secondary | ICD-10-CM

## 2019-11-18 ENCOUNTER — Other Ambulatory Visit: Payer: Self-pay

## 2019-11-18 ENCOUNTER — Ambulatory Visit
Admission: RE | Admit: 2019-11-18 | Discharge: 2019-11-18 | Disposition: A | Discharge status: Home / Self Care | Payer: BC Managed Care – PPO | Source: Ambulatory Visit | Attending: Obstetrics & Gynecology | Admitting: Obstetrics & Gynecology

## 2019-11-18 DIAGNOSIS — Z1231 Encounter for screening mammogram for malignant neoplasm of breast: Secondary | ICD-10-CM

## 2019-12-15 ENCOUNTER — Other Ambulatory Visit: Payer: Self-pay | Admitting: Family Medicine

## 2020-01-03 ENCOUNTER — Other Ambulatory Visit: Payer: BC Managed Care – PPO

## 2020-01-05 ENCOUNTER — Telehealth: Payer: Self-pay | Admitting: Family Medicine

## 2020-01-05 DIAGNOSIS — E7800 Pure hypercholesterolemia, unspecified: Secondary | ICD-10-CM

## 2020-01-05 DIAGNOSIS — E559 Vitamin D deficiency, unspecified: Secondary | ICD-10-CM

## 2020-01-05 DIAGNOSIS — E039 Hypothyroidism, unspecified: Secondary | ICD-10-CM

## 2020-01-05 DIAGNOSIS — I10 Essential (primary) hypertension: Secondary | ICD-10-CM

## 2020-01-05 NOTE — Telephone Encounter (Signed)
-----   Message from Cloyd Stagers, RT sent at 12/23/2019  1:36 PM EDT ----- Regarding: Lab Orders for Monday 9.13.2021 Please place lab orders for Monday 9.13.2021, office visit for physical on Friday 9.17.2021 Thank you, Dyke Maes RT(R)

## 2020-01-06 ENCOUNTER — Other Ambulatory Visit: Payer: Self-pay

## 2020-01-06 ENCOUNTER — Other Ambulatory Visit (INDEPENDENT_AMBULATORY_CARE_PROVIDER_SITE_OTHER): Payer: BC Managed Care – PPO

## 2020-01-06 DIAGNOSIS — I10 Essential (primary) hypertension: Secondary | ICD-10-CM

## 2020-01-06 DIAGNOSIS — E559 Vitamin D deficiency, unspecified: Secondary | ICD-10-CM | POA: Diagnosis not present

## 2020-01-06 DIAGNOSIS — E7800 Pure hypercholesterolemia, unspecified: Secondary | ICD-10-CM | POA: Diagnosis not present

## 2020-01-06 DIAGNOSIS — E039 Hypothyroidism, unspecified: Secondary | ICD-10-CM | POA: Diagnosis not present

## 2020-01-06 LAB — CBC WITH DIFFERENTIAL/PLATELET
Basophils Absolute: 0 10*3/uL (ref 0.0–0.1)
Basophils Relative: 0.6 % (ref 0.0–3.0)
Eosinophils Absolute: 0.5 10*3/uL (ref 0.0–0.7)
Eosinophils Relative: 6.8 % — ABNORMAL HIGH (ref 0.0–5.0)
HCT: 39 % (ref 36.0–46.0)
Hemoglobin: 13.3 g/dL (ref 12.0–15.0)
Lymphocytes Relative: 44.1 % (ref 12.0–46.0)
Lymphs Abs: 3 10*3/uL (ref 0.7–4.0)
MCHC: 34.2 g/dL (ref 30.0–36.0)
MCV: 92.5 fl (ref 78.0–100.0)
Monocytes Absolute: 0.6 10*3/uL (ref 0.1–1.0)
Monocytes Relative: 8.2 % (ref 3.0–12.0)
Neutro Abs: 2.7 10*3/uL (ref 1.4–7.7)
Neutrophils Relative %: 40.3 % — ABNORMAL LOW (ref 43.0–77.0)
Platelets: 229 10*3/uL (ref 150.0–400.0)
RBC: 4.22 Mil/uL (ref 3.87–5.11)
RDW: 12.4 % (ref 11.5–15.5)
WBC: 6.8 10*3/uL (ref 4.0–10.5)

## 2020-01-06 LAB — LIPID PANEL
Cholesterol: 142 mg/dL (ref 0–200)
HDL: 43.7 mg/dL (ref >39.00)
LDL Cholesterol: 83 mg/dL (ref 0–99)
NonHDL: 98.43
Total CHOL/HDL Ratio: 3
Triglycerides: 75 mg/dL (ref 0.0–149.0)
VLDL: 15 mg/dL (ref 0.0–40.0)

## 2020-01-06 LAB — COMPREHENSIVE METABOLIC PANEL
ALT: 24 U/L (ref 0–35)
AST: 34 U/L (ref 0–37)
Albumin: 4.1 g/dL (ref 3.5–5.2)
Alkaline Phosphatase: 52 U/L (ref 39–117)
BUN: 10 mg/dL (ref 6–23)
CO2: 29 mEq/L (ref 19–32)
Calcium: 9.1 mg/dL (ref 8.4–10.5)
Chloride: 104 mEq/L (ref 96–112)
Creatinine, Ser: 0.71 mg/dL (ref 0.40–1.20)
GFR: 82.95 mL/min (ref >60.00)
Glucose, Bld: 100 mg/dL — ABNORMAL HIGH (ref 70–99)
Potassium: 4 mEq/L (ref 3.5–5.1)
Sodium: 141 mEq/L (ref 135–145)
Total Bilirubin: 0.6 mg/dL (ref 0.2–1.2)
Total Protein: 6.8 g/dL (ref 6.0–8.3)

## 2020-01-06 LAB — VITAMIN D 25 HYDROXY (VIT D DEFICIENCY, FRACTURES): VITD: 32.96 ng/mL (ref 30.00–100.00)

## 2020-01-06 LAB — TSH: TSH: 0.16 u[IU]/mL — ABNORMAL LOW (ref 0.35–4.50)

## 2020-01-10 ENCOUNTER — Encounter: Payer: Self-pay | Admitting: Family Medicine

## 2020-01-10 ENCOUNTER — Other Ambulatory Visit: Payer: Self-pay

## 2020-01-10 ENCOUNTER — Ambulatory Visit (INDEPENDENT_AMBULATORY_CARE_PROVIDER_SITE_OTHER): Payer: BC Managed Care – PPO | Admitting: Family Medicine

## 2020-01-10 VITALS — BP 124/62 | HR 68 | Temp 96.7°F | Ht 64.0 in | Wt 182.0 lb

## 2020-01-10 DIAGNOSIS — E7800 Pure hypercholesterolemia, unspecified: Secondary | ICD-10-CM

## 2020-01-10 DIAGNOSIS — I10 Essential (primary) hypertension: Secondary | ICD-10-CM | POA: Diagnosis not present

## 2020-01-10 DIAGNOSIS — E039 Hypothyroidism, unspecified: Secondary | ICD-10-CM | POA: Diagnosis not present

## 2020-01-10 DIAGNOSIS — Z0000 Encounter for general adult medical examination without abnormal findings: Secondary | ICD-10-CM | POA: Diagnosis not present

## 2020-01-10 DIAGNOSIS — E669 Obesity, unspecified: Secondary | ICD-10-CM

## 2020-01-10 DIAGNOSIS — E559 Vitamin D deficiency, unspecified: Secondary | ICD-10-CM

## 2020-01-10 MED ORDER — LOSARTAN POTASSIUM 100 MG PO TABS
100.0000 mg | ORAL_TABLET | Freq: Every day | ORAL | 3 refills | Status: DC
Start: 2020-01-10 — End: 2021-01-29

## 2020-01-10 MED ORDER — LEVOTHYROXINE SODIUM 150 MCG PO TABS
ORAL_TABLET | ORAL | 3 refills | Status: DC
Start: 2020-01-10 — End: 2020-11-13
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
## 2020-01-10 NOTE — Assessment & Plan Note (Signed)
Taking 2000 iu daily with mvi  Some time outdoors Level of 32.9  Disc imp for bone and gen health

## 2020-01-10 NOTE — Assessment & Plan Note (Signed)
Discussed how this problem influences overall health and the risks it imposes  Reviewed plan for weight loss with lower calorie diet (via better food choices and also portion control or program like weight watchers) and exercise building up to or more than 30 minutes 5 days per week including some aerobic activity   She has started to loose wt slowly  This will dec DM risk

## 2020-01-10 NOTE — Assessment & Plan Note (Signed)
TSH is low Lab Results  Component Value Date   TSH 0.16 (L) 01/06/2020    Pt took too much thyroid med on vacation prior to that  Will re check it in 2 mo  No dose change

## 2020-01-10 NOTE — Assessment & Plan Note (Signed)
Reviewed health habits including diet and exercise and skin cancer prevention Reviewed appropriate screening tests for age  Also reviewed health mt list, fam hx and immunization status , as well as social and family history   covid immunized  Will get flu shot at work  Had shingrix vaccines Sent for last pap from her gyn (she also has appt in oct)

## 2020-01-10 NOTE — Assessment & Plan Note (Signed)
Stable  Disc goals for lipids and reasons to control them Rev last labs with pt Rev low sat fat diet in detail  fam hx of CAD  Stressed imp of keeping this optimal

## 2020-01-10 NOTE — Assessment & Plan Note (Signed)
bp in fair control at this time  BP Readings from Last 1 Encounters:  01/10/20 124/62   No changes needed Most recent labs reviewed  Disc lifstyle change with low sodium diet and exercise

## 2020-01-10 NOTE — Patient Instructions (Addendum)
Let's re check thyroid number in 2 months   To prevent diabetes Try to get most of your carbohydrates from produce (with the exception of white potatoes)  Eat less bread/pasta/rice/snack foods/cereals/sweets and other items from the middle of the grocery store (processed carbs)  For cholesterol Avoid red meat/ fried foods/ egg yolks/ fatty breakfast meats/ butter, cheese and high fat dairy/ and shellfish    Keep adding more exercise   Take care of yourself

## 2020-01-11 DIAGNOSIS — R739 Hyperglycemia, unspecified: Secondary | ICD-10-CM | POA: Insufficient documentation

## 2020-01-11 DIAGNOSIS — R7303 Prediabetes: Secondary | ICD-10-CM | POA: Insufficient documentation

## 2020-03-12 ENCOUNTER — Telehealth: Payer: Self-pay | Admitting: Family Medicine

## 2020-03-12 DIAGNOSIS — E039 Hypothyroidism, unspecified: Secondary | ICD-10-CM

## 2020-03-12 NOTE — Telephone Encounter (Signed)
-----   Message from Ellamae Sia sent at 03/03/2020  3:25 PM EST ----- Regarding: Lab orders for Friday, 11.19.21 Lab orders, TSH?

## 2020-03-13 ENCOUNTER — Other Ambulatory Visit (INDEPENDENT_AMBULATORY_CARE_PROVIDER_SITE_OTHER): Payer: BC Managed Care – PPO

## 2020-03-13 ENCOUNTER — Other Ambulatory Visit: Payer: Self-pay

## 2020-03-13 DIAGNOSIS — E039 Hypothyroidism, unspecified: Secondary | ICD-10-CM

## 2020-03-13 LAB — TSH: TSH: 0.64 u[IU]/mL (ref 0.35–4.50)

## 2020-10-19 ENCOUNTER — Other Ambulatory Visit: Payer: Self-pay | Admitting: Obstetrics & Gynecology

## 2020-10-19 DIAGNOSIS — Z1231 Encounter for screening mammogram for malignant neoplasm of breast: Secondary | ICD-10-CM

## 2020-11-12 ENCOUNTER — Other Ambulatory Visit: Payer: Self-pay | Admitting: Family Medicine

## 2020-12-11 ENCOUNTER — Other Ambulatory Visit: Payer: Self-pay

## 2020-12-11 ENCOUNTER — Ambulatory Visit
Admission: RE | Admit: 2020-12-11 | Discharge: 2020-12-11 | Disposition: A | Discharge status: Home / Self Care | Payer: BC Managed Care – PPO | Source: Ambulatory Visit | Attending: Obstetrics & Gynecology | Admitting: Obstetrics & Gynecology

## 2020-12-11 DIAGNOSIS — Z1231 Encounter for screening mammogram for malignant neoplasm of breast: Secondary | ICD-10-CM

## 2021-01-08 ENCOUNTER — Other Ambulatory Visit (INDEPENDENT_AMBULATORY_CARE_PROVIDER_SITE_OTHER): Payer: BC Managed Care – PPO

## 2021-01-08 ENCOUNTER — Other Ambulatory Visit: Payer: Self-pay

## 2021-01-08 ENCOUNTER — Other Ambulatory Visit: Payer: BC Managed Care – PPO

## 2021-01-08 DIAGNOSIS — E039 Hypothyroidism, unspecified: Secondary | ICD-10-CM

## 2021-01-08 LAB — TSH: TSH: 0.71 u[IU]/mL (ref 0.35–5.50)

## 2021-01-15 ENCOUNTER — Encounter: Payer: BC Managed Care – PPO | Admitting: Family Medicine

## 2021-01-15 ENCOUNTER — Encounter: Payer: Self-pay | Admitting: Family Medicine

## 2021-01-22 ENCOUNTER — Encounter: Payer: BC Managed Care – PPO | Admitting: Family Medicine

## 2021-01-28 ENCOUNTER — Other Ambulatory Visit: Payer: Self-pay | Admitting: Family Medicine

## 2021-02-02 ENCOUNTER — Other Ambulatory Visit: Payer: Self-pay | Admitting: Family Medicine

## 2021-02-03 NOTE — Telephone Encounter (Signed)
Last OV - 01/09/2021 Next OV - 02/09/2021 Last Filled - 11/13/20 (synthroid)   01/29/2021 losartan - I refused it for being too early

## 2021-02-09 ENCOUNTER — Ambulatory Visit (INDEPENDENT_AMBULATORY_CARE_PROVIDER_SITE_OTHER): Payer: BC Managed Care – PPO | Admitting: Family Medicine

## 2021-02-09 ENCOUNTER — Other Ambulatory Visit: Payer: Self-pay

## 2021-02-09 ENCOUNTER — Encounter: Payer: Self-pay | Admitting: Family Medicine

## 2021-02-09 VITALS — BP 118/80 | HR 68 | Temp 97.8°F | Resp 10 | Ht 64.0 in | Wt 178.5 lb

## 2021-02-09 DIAGNOSIS — E559 Vitamin D deficiency, unspecified: Secondary | ICD-10-CM

## 2021-02-09 DIAGNOSIS — E039 Hypothyroidism, unspecified: Secondary | ICD-10-CM | POA: Diagnosis not present

## 2021-02-09 DIAGNOSIS — I10 Essential (primary) hypertension: Secondary | ICD-10-CM

## 2021-02-09 DIAGNOSIS — R059 Cough, unspecified: Secondary | ICD-10-CM | POA: Insufficient documentation

## 2021-02-09 DIAGNOSIS — E7800 Pure hypercholesterolemia, unspecified: Secondary | ICD-10-CM

## 2021-02-09 DIAGNOSIS — R739 Hyperglycemia, unspecified: Secondary | ICD-10-CM | POA: Diagnosis not present

## 2021-02-09 DIAGNOSIS — Z0000 Encounter for general adult medical examination without abnormal findings: Secondary | ICD-10-CM

## 2021-02-09 DIAGNOSIS — E669 Obesity, unspecified: Secondary | ICD-10-CM

## 2021-02-09 DIAGNOSIS — Z86010 Personal history of colonic polyps: Secondary | ICD-10-CM

## 2021-02-09 NOTE — Assessment & Plan Note (Signed)
Reviewed health habits including diet and exercise and skin cancer prevention Reviewed appropriate screening tests for age  Also reviewed health mt list, fam hx and immunization status , as well as social and family history   See HPI Labs reviewed  Pap utd, sent for last report  dexa nl at gyn office, sent for  Mammogram utd Colonoscopy utd PHQ score of 0

## 2021-02-09 NOTE — Patient Instructions (Addendum)
We should be back to [facility] after jan 1 Please call then to schedule chest xray for cough   Lab today  Ekg today   Take care of yourself
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
## 2021-02-09 NOTE — Assessment & Plan Note (Signed)
Mild throat clearing cough  Possible pnd Denies heartbutn  On arb (? If side eff) -she had ace cough in the past  Some intermittent chest pressure Nl ekg  Will return for cxr in the future if not improved

## 2021-02-09 NOTE — Assessment & Plan Note (Signed)
Colonoscopy is due nov 2023

## 2021-02-09 NOTE — Assessment & Plan Note (Signed)
Taking 5000 iu daily D3 Level drawn

## 2021-02-09 NOTE — Assessment & Plan Note (Signed)
A1C done today  disc imp of low glycemic diet and wt loss to prevent DM2

## 2021-02-09 NOTE — Assessment & Plan Note (Signed)
bp in fair control at this time  BP Readings from Last 1 Encounters:  02/09/21 118/80   No changes needed Most recent labs reviewed  Disc lifstyle change with low sodium diet and exercise  Taking cozaar 100 mg daily  Some dry cough-unsure if this could cause it (ace cough in the past) Labs ordered

## 2021-02-09 NOTE — Assessment & Plan Note (Signed)
Disc goals for lipids and reasons to control them Rev last labs with pt Rev low sat fat diet in detail In past had mild muscle aches on atorvastatin   Lipid panel done  Diet has improved

## 2021-02-09 NOTE — Assessment & Plan Note (Signed)
Hypothyroidism  Pt has no clinical changes No change in energy level/ hair or skin/ edema and no tremor Lab Results  Component Value Date   TSH 0.71 01/08/2021     Plan to continue levothyroxine 150 mcg daily

## 2021-02-09 NOTE — Assessment & Plan Note (Signed)
Discussed how this problem influences overall health and the risks it imposes  Reviewed plan for weight loss with lower calorie diet (via better food choices and also portion control or program like weight watchers) and exercise building up to or more than 30 minutes 5 days per week including some aerobic activity    

## 2021-02-10 LAB — CBC WITH DIFFERENTIAL/PLATELET
Basophils Absolute: 0.1 10*3/uL (ref 0.0–0.1)
Basophils Relative: 0.8 % (ref 0.0–3.0)
Eosinophils Absolute: 0.5 10*3/uL (ref 0.0–0.7)
Eosinophils Relative: 5.8 % — ABNORMAL HIGH (ref 0.0–5.0)
HCT: 41.6 % (ref 36.0–46.0)
Hemoglobin: 14 g/dL (ref 12.0–15.0)
Lymphocytes Relative: 44.1 % (ref 12.0–46.0)
Lymphs Abs: 4.2 10*3/uL — ABNORMAL HIGH (ref 0.7–4.0)
MCHC: 33.7 g/dL (ref 30.0–36.0)
MCV: 92.3 fl (ref 78.0–100.0)
Monocytes Absolute: 0.8 10*3/uL (ref 0.1–1.0)
Monocytes Relative: 8.3 % (ref 3.0–12.0)
Neutro Abs: 3.9 10*3/uL (ref 1.4–7.7)
Neutrophils Relative %: 41 % — ABNORMAL LOW (ref 43.0–77.0)
Platelets: 263 10*3/uL (ref 150.0–400.0)
RBC: 4.51 Mil/uL (ref 3.87–5.11)
RDW: 12.2 % (ref 11.5–15.5)
WBC: 9.4 10*3/uL (ref 4.0–10.5)

## 2021-02-10 LAB — COMPREHENSIVE METABOLIC PANEL
ALT: 24 U/L (ref 0–35)
AST: 31 U/L (ref 0–37)
Albumin: 4.6 g/dL (ref 3.5–5.2)
Alkaline Phosphatase: 57 U/L (ref 39–117)
BUN: 10 mg/dL (ref 6–23)
CO2: 30 mEq/L (ref 19–32)
Calcium: 9.8 mg/dL (ref 8.4–10.5)
Chloride: 103 mEq/L (ref 96–112)
Creatinine, Ser: 0.73 mg/dL (ref 0.40–1.20)
GFR: 86.68 mL/min (ref >60.00)
Glucose, Bld: 79 mg/dL (ref 70–99)
Potassium: 3.9 mEq/L (ref 3.5–5.1)
Sodium: 139 mEq/L (ref 135–145)
Total Bilirubin: 0.6 mg/dL (ref 0.2–1.2)
Total Protein: 8 g/dL (ref 6.0–8.3)

## 2021-02-10 LAB — HEMOGLOBIN A1C: Hgb A1c MFr Bld: 6 % (ref 4.6–6.5)

## 2021-02-10 LAB — LIPID PANEL
Cholesterol: 160 mg/dL (ref 0–200)
HDL: 51.8 mg/dL (ref >39.00)
LDL Cholesterol: 91 mg/dL (ref 0–99)
NonHDL: 107.86
Total CHOL/HDL Ratio: 3
Triglycerides: 84 mg/dL (ref 0.0–149.0)
VLDL: 16.8 mg/dL (ref 0.0–40.0)

## 2021-02-10 LAB — VITAMIN D 25 HYDROXY (VIT D DEFICIENCY, FRACTURES): VITD: 74.82 ng/mL (ref 30.00–100.00)

## 2021-04-30 DIAGNOSIS — H2513 Age-related nuclear cataract, bilateral: Secondary | ICD-10-CM | POA: Diagnosis not present

## 2021-04-30 DIAGNOSIS — H40013 Open angle with borderline findings, low risk, bilateral: Secondary | ICD-10-CM | POA: Diagnosis not present

## 2021-04-30 DIAGNOSIS — H04123 Dry eye syndrome of bilateral lacrimal glands: Secondary | ICD-10-CM | POA: Diagnosis not present

## 2021-04-30 DIAGNOSIS — H524 Presbyopia: Secondary | ICD-10-CM | POA: Diagnosis not present

## 2021-06-01 ENCOUNTER — Telehealth: Payer: Self-pay | Admitting: Family Medicine

## 2021-06-01 NOTE — Telephone Encounter (Signed)
Missy called in from [facility] and wanted to know if it is okay to change the medication manufacturer of levothyroxine (SYNTHROID) 150 MCG tablet from [employer] to [employer]

## 2021-06-01 NOTE — Telephone Encounter (Signed)
Pharmacy notified and they will have pharmacist call pt to let her know

## 2021-06-01 NOTE — Telephone Encounter (Signed)
It is ok with me if ok with the patient. If any clinical changes please make me aware

## 2021-06-25 DIAGNOSIS — Z01419 Encounter for gynecological examination (general) (routine) without abnormal findings: Secondary | ICD-10-CM | POA: Diagnosis not present

## 2021-08-11 ENCOUNTER — Telehealth: Payer: Self-pay | Admitting: Family Medicine

## 2021-08-11 DIAGNOSIS — I10 Essential (primary) hypertension: Secondary | ICD-10-CM

## 2021-08-11 DIAGNOSIS — R739 Hyperglycemia, unspecified: Secondary | ICD-10-CM

## 2021-08-11 DIAGNOSIS — E559 Vitamin D deficiency, unspecified: Secondary | ICD-10-CM

## 2021-08-11 DIAGNOSIS — E7800 Pure hypercholesterolemia, unspecified: Secondary | ICD-10-CM

## 2021-08-11 DIAGNOSIS — E039 Hypothyroidism, unspecified: Secondary | ICD-10-CM

## 2021-08-11 NOTE — Telephone Encounter (Signed)
-----   Message from Velna Hatchet, RT sent at 07/26/2021 10:44 AM EDT ----- ?Regarding: Lab orders needed for appt on Friday, 08/13/2021 ?Patient is scheduled for cpx, please order future labs.  Thanks, Anda Kraft  ? ?

## 2021-08-13 ENCOUNTER — Other Ambulatory Visit (INDEPENDENT_AMBULATORY_CARE_PROVIDER_SITE_OTHER): Payer: PPO

## 2021-08-13 DIAGNOSIS — R739 Hyperglycemia, unspecified: Secondary | ICD-10-CM

## 2021-08-13 DIAGNOSIS — E7800 Pure hypercholesterolemia, unspecified: Secondary | ICD-10-CM | POA: Diagnosis not present

## 2021-08-13 DIAGNOSIS — E559 Vitamin D deficiency, unspecified: Secondary | ICD-10-CM

## 2021-08-13 DIAGNOSIS — I10 Essential (primary) hypertension: Secondary | ICD-10-CM

## 2021-08-13 DIAGNOSIS — E039 Hypothyroidism, unspecified: Secondary | ICD-10-CM | POA: Diagnosis not present

## 2021-08-13 LAB — CBC WITH DIFFERENTIAL/PLATELET
Basophils Absolute: 0.1 10*3/uL (ref 0.0–0.1)
Basophils Relative: 1.1 % (ref 0.0–3.0)
Eosinophils Absolute: 0.3 10*3/uL (ref 0.0–0.7)
Eosinophils Relative: 4.2 % (ref 0.0–5.0)
HCT: 42.8 % (ref 36.0–46.0)
Hemoglobin: 14.4 g/dL (ref 12.0–15.0)
Lymphocytes Relative: 42.5 % (ref 12.0–46.0)
Lymphs Abs: 3.1 10*3/uL (ref 0.7–4.0)
MCHC: 33.5 g/dL (ref 30.0–36.0)
MCV: 93.3 fl (ref 78.0–100.0)
Monocytes Absolute: 0.6 10*3/uL (ref 0.1–1.0)
Monocytes Relative: 8.2 % (ref 3.0–12.0)
Neutro Abs: 3.2 10*3/uL (ref 1.4–7.7)
Neutrophils Relative %: 44 % (ref 43.0–77.0)
Platelets: 252 10*3/uL (ref 150.0–400.0)
RBC: 4.59 Mil/uL (ref 3.87–5.11)
RDW: 12.8 % (ref 11.5–15.5)
WBC: 7.2 10*3/uL (ref 4.0–10.5)

## 2021-08-13 LAB — LIPID PANEL
Cholesterol: 150 mg/dL (ref 0–200)
HDL: 52.6 mg/dL (ref >39.00)
LDL Cholesterol: 83 mg/dL (ref 0–99)
NonHDL: 97.81
Total CHOL/HDL Ratio: 3
Triglycerides: 74 mg/dL (ref 0.0–149.0)
VLDL: 14.8 mg/dL (ref 0.0–40.0)

## 2021-08-13 LAB — COMPREHENSIVE METABOLIC PANEL
ALT: 20 U/L (ref 0–35)
AST: 26 U/L (ref 0–37)
Albumin: 4.3 g/dL (ref 3.5–5.2)
Alkaline Phosphatase: 56 U/L (ref 39–117)
BUN: 12 mg/dL (ref 6–23)
CO2: 26 mEq/L (ref 19–32)
Calcium: 9.5 mg/dL (ref 8.4–10.5)
Chloride: 104 mEq/L (ref 96–112)
Creatinine, Ser: 0.72 mg/dL (ref 0.40–1.20)
GFR: 87.81 mL/min (ref >60.00)
Glucose, Bld: 98 mg/dL (ref 70–99)
Potassium: 3.9 mEq/L (ref 3.5–5.1)
Sodium: 138 mEq/L (ref 135–145)
Total Bilirubin: 0.4 mg/dL (ref 0.2–1.2)
Total Protein: 7.3 g/dL (ref 6.0–8.3)

## 2021-08-13 LAB — TSH: TSH: 1.02 u[IU]/mL (ref 0.35–5.50)

## 2021-08-13 LAB — HEMOGLOBIN A1C: Hgb A1c MFr Bld: 6 % (ref 4.6–6.5)

## 2021-08-13 LAB — VITAMIN D 25 HYDROXY (VIT D DEFICIENCY, FRACTURES): VITD: 74.38 ng/mL (ref 30.00–100.00)

## 2021-08-20 ENCOUNTER — Ambulatory Visit (INDEPENDENT_AMBULATORY_CARE_PROVIDER_SITE_OTHER): Payer: PPO | Admitting: Family Medicine

## 2021-08-20 ENCOUNTER — Encounter: Payer: Self-pay | Admitting: Family Medicine

## 2021-08-20 VITALS — BP 126/68 | HR 66 | Temp 97.6°F | Ht 63.5 in | Wt 176.0 lb

## 2021-08-20 DIAGNOSIS — Z8249 Family history of ischemic heart disease and other diseases of the circulatory system: Secondary | ICD-10-CM | POA: Diagnosis not present

## 2021-08-20 DIAGNOSIS — E039 Hypothyroidism, unspecified: Secondary | ICD-10-CM | POA: Diagnosis not present

## 2021-08-20 DIAGNOSIS — E669 Obesity, unspecified: Secondary | ICD-10-CM | POA: Diagnosis not present

## 2021-08-20 DIAGNOSIS — Z0000 Encounter for general adult medical examination without abnormal findings: Secondary | ICD-10-CM | POA: Diagnosis not present

## 2021-08-20 DIAGNOSIS — E559 Vitamin D deficiency, unspecified: Secondary | ICD-10-CM | POA: Diagnosis not present

## 2021-08-20 DIAGNOSIS — I10 Essential (primary) hypertension: Secondary | ICD-10-CM | POA: Diagnosis not present

## 2021-08-20 DIAGNOSIS — Z23 Encounter for immunization: Secondary | ICD-10-CM | POA: Diagnosis not present

## 2021-08-20 DIAGNOSIS — E7800 Pure hypercholesterolemia, unspecified: Secondary | ICD-10-CM

## 2021-08-20 DIAGNOSIS — R739 Hyperglycemia, unspecified: Secondary | ICD-10-CM

## 2021-08-20 MED ORDER — LEVOTHYROXINE SODIUM 150 MCG PO TABS: ORAL_TABLET | ORAL | 3 refills | Status: DC

## 2021-08-20 MED ORDER — LOSARTAN POTASSIUM 100 MG PO TABS: 100.0000 mg | ORAL_TABLET | Freq: Every day | ORAL | 3 refills | Status: DC
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
## 2021-08-20 NOTE — Patient Instructions (Addendum)
Pneumonia vaccine today  ? ?Colonoscopy is due in November  ? ?I placed a referral to cardiology to have a visit to discuss risk factors ? ?If you don't get a call in 1-2 weeks then call us ? ?Avoid red meat/ fried foods/ egg yolks/ fatty breakfast meats/ butter, cheese and high fat dairy/ and shellfish  ? ?Try to get most of your carbohydrates from produce (with the exception of white potatoes)  ?Eat less bread/pasta/rice/snack foods/cereals/sweets and other items from the middle of the grocery store (processed carbs) ? ? ? ? ? ?

## 2021-08-22 NOTE — Assessment & Plan Note (Signed)
Disc goals for lipids and reasons to control them ?Rev last labs with pt ?Rev low sat fat diet in detail ?Intolerant of statin in the past  ?Interested in ref to cardiology to discuss vascular risk factors  ?

## 2021-08-22 NOTE — Assessment & Plan Note (Signed)
Hypothyroidism  ?Pt has no clinical changes ?No change in energy level/ hair or skin/ edema and no tremor ?Lab Results  ?Component Value Date  ? TSH 1.02 08/13/2021  ?  ?Plan to continue levothyroxine 100 mcg intermittently 1 and 1/2 pill as directed  ?

## 2021-08-22 NOTE — Assessment & Plan Note (Signed)
bp in fair control at this time  ?BP Readings from Last 1 Encounters:  ?08/20/21 126/68  ? ?No changes needed ?Most recent labs reviewed  ?Disc lifstyle change with low sodium diet and exercise  ?Plan to continue losartan 100 mg daily ?

## 2021-08-22 NOTE — Assessment & Plan Note (Signed)
Discussed how this problem influences overall health and the risks it imposes  Reviewed plan for weight loss with lower calorie diet (via better food choices and also portion control or program like weight watchers) and exercise building up to or more than 30 minutes 5 days per week including some aerobic activity    

## 2021-08-22 NOTE — Assessment & Plan Note (Signed)
Nl level at 74 ? ?Vitamin D level is therapeutic with current supplementation ?Disc importance of this to bone and overall health ? ?

## 2021-08-22 NOTE — Assessment & Plan Note (Signed)
Lab Results  ?Component Value Date  ? HGBA1C 6.0 08/13/2021  ? ?disc imp of low glycemic diet and wt loss to prevent DM2  ?

## 2021-08-22 NOTE — Assessment & Plan Note (Signed)
Reviewed health habits including diet and exercise and skin cancer prevention ?Reviewed appropriate screening tests for age  ?Also reviewed health mt list, fam hx and immunization status , as well as social and family history   ?See HPI ?Labs reviewed  ?Advance directive utd ?No cognitive concerns ?Reviewed hearing and vision screen/nl  ?phq score of 0 ?No help needed with ADLs, nl functionality ?pna 20 vaccine given today  ?dexa utd with no falls or fractures ?Colonoscopy utd, due in nov for 5 y recall ? ?
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
## 2021-09-03 ENCOUNTER — Encounter: Payer: Self-pay | Admitting: Interventional Cardiology

## 2021-09-03 ENCOUNTER — Ambulatory Visit: Payer: PPO | Admitting: Interventional Cardiology

## 2021-09-03 VITALS — BP 112/82 | HR 68 | Ht 63.5 in | Wt 176.4 lb

## 2021-09-03 DIAGNOSIS — I10 Essential (primary) hypertension: Secondary | ICD-10-CM | POA: Diagnosis not present

## 2021-09-03 DIAGNOSIS — E7800 Pure hypercholesterolemia, unspecified: Secondary | ICD-10-CM | POA: Diagnosis not present

## 2021-09-03 DIAGNOSIS — I447 Left bundle-branch block, unspecified: Secondary | ICD-10-CM

## 2021-09-03 DIAGNOSIS — Z8249 Family history of ischemic heart disease and other diseases of the circulatory system: Secondary | ICD-10-CM | POA: Diagnosis not present

## 2021-09-03 NOTE — Patient Instructions (Signed)
Medication Instructions:  ?Your physician recommends that you continue on your current medications as directed. Please refer to the Current Medication list given to you today. ? ?*If you need a refill on your cardiac medications before your next appointment, please call your pharmacy* ? ?Lab Work: ?NONE ? ?Testing/Procedures: ?Your physician has requested you have a coronary calcium score. ? ?Follow-Up: ?At [facility], you and your health needs are our priority.  As part of our continuing mission to provide you with exceptional heart care, we have created designated Provider Care Teams.  These Care Teams include your primary Cardiologist (physician) and Advanced Practice Providers (APPs -  Physician Assistants and Nurse Practitioners) who all work together to provide you with the care you need, when you need it. ? ?Your next appointment:   ?As needed pending results of calcium score ? ?The format for your next appointment:   ?In Person ? ?Provider:   ?Sinclair Grooms, MD { ? ? ?Important Information About Sugar ? ? ? ? ?  ?

## 2021-10-04 ENCOUNTER — Ambulatory Visit (HOSPITAL_BASED_OUTPATIENT_CLINIC_OR_DEPARTMENT_OTHER)
Admission: RE | Admit: 2021-10-04 | Discharge: 2021-10-04 | Disposition: A | Discharge status: Home / Self Care | Payer: BC Managed Care – PPO | Source: Ambulatory Visit | Attending: Interventional Cardiology | Admitting: Interventional Cardiology

## 2021-10-04 DIAGNOSIS — Z8249 Family history of ischemic heart disease and other diseases of the circulatory system: Secondary | ICD-10-CM | POA: Insufficient documentation

## 2021-10-29 ENCOUNTER — Inpatient Hospital Stay: Admission: RE | Admit: 2021-10-29 | Payer: BC Managed Care – PPO | Source: Ambulatory Visit

## 2021-11-15 ENCOUNTER — Other Ambulatory Visit: Payer: Self-pay | Admitting: Obstetrics & Gynecology

## 2021-11-15 DIAGNOSIS — Z1231 Encounter for screening mammogram for malignant neoplasm of breast: Secondary | ICD-10-CM

## 2021-12-17 ENCOUNTER — Ambulatory Visit
Admission: RE | Admit: 2021-12-17 | Discharge: 2021-12-17 | Disposition: A | Discharge status: Home / Self Care | Payer: PPO | Source: Ambulatory Visit | Attending: Obstetrics & Gynecology | Admitting: Obstetrics & Gynecology

## 2021-12-17 DIAGNOSIS — Z1231 Encounter for screening mammogram for malignant neoplasm of breast: Secondary | ICD-10-CM | POA: Diagnosis not present

## 2022-02-15 ENCOUNTER — Encounter: Payer: Self-pay | Admitting: Gastroenterology

## 2022-03-02 ENCOUNTER — Ambulatory Visit (AMBULATORY_SURGERY_CENTER): Payer: Self-pay

## 2022-03-02 VITALS — Ht 64.0 in | Wt 174.0 lb

## 2022-03-02 DIAGNOSIS — Z86010 Personal history of colonic polyps: Secondary | ICD-10-CM

## 2022-03-02 MED ORDER — NA SULFATE-K SULFATE-MG SULF 17.5-3.13-1.6 GM/177ML PO SOLN: 1.0000 | Freq: Once | ORAL | 0 refills | Status: AC

## 2022-03-02 NOTE — Progress Notes (Signed)

## 2022-03-25 ENCOUNTER — Encounter: Payer: Self-pay | Admitting: Gastroenterology

## 2022-03-29 ENCOUNTER — Ambulatory Visit (AMBULATORY_SURGERY_CENTER): Payer: PPO | Admitting: Gastroenterology

## 2022-03-29 ENCOUNTER — Encounter: Payer: Self-pay | Admitting: Gastroenterology

## 2022-03-29 VITALS — BP 100/61 | HR 66 | Temp 96.9°F | Resp 15 | Ht 63.0 in | Wt 174.0 lb

## 2022-03-29 DIAGNOSIS — Z09 Encounter for follow-up examination after completed treatment for conditions other than malignant neoplasm: Secondary | ICD-10-CM

## 2022-03-29 DIAGNOSIS — Z86010 Personal history of colonic polyps: Secondary | ICD-10-CM | POA: Diagnosis not present

## 2022-03-29 DIAGNOSIS — D122 Benign neoplasm of ascending colon: Secondary | ICD-10-CM | POA: Diagnosis not present

## 2022-03-29 DIAGNOSIS — K635 Polyp of colon: Secondary | ICD-10-CM

## 2022-03-29 DIAGNOSIS — I10 Essential (primary) hypertension: Secondary | ICD-10-CM | POA: Diagnosis not present

## 2022-03-29 MED ORDER — SODIUM CHLORIDE 0.9 % IV SOLN: 500.0000 mL | Freq: Once | INTRAVENOUS | Status: DC

## 2022-03-29 NOTE — Op Note (Signed)
[place] Patient Name: Kaitlin Carter Procedure Date: 03/29/2022 10:15 AM MRN: [id number] Endoscopist: Justice Britain , MD, [id number] Age: 66 Referring MD:  Date of Birth: 30-Jun-1955 Gender: Female Account #: [id number] Procedure:                Colonoscopy Indications:              Surveillance: Personal history of adenomatous                            polyps on last colonoscopy 5 years ago Medicines:                Monitored Anesthesia Care Procedure:                Pre-Anesthesia Assessment:                           - Prior to the procedure, a History and Physical                            was performed, and patient medications and                            allergies were reviewed. The patient's tolerance of                            previous anesthesia was also reviewed. The risks                            and benefits of the procedure and the sedation                            options and risks were discussed with the patient.                            All questions were answered, and informed consent                            was obtained. Prior Anticoagulants: The patient has                            taken no anticoagulant or antiplatelet agents                            except for aspirin. ASA Grade Assessment: II - A                            patient with mild systemic disease. After reviewing                            the risks and benefits, the patient was deemed in                            satisfactory condition to undergo the procedure.  After obtaining informed consent, the colonoscope                            was passed under direct vision. Throughout the                            procedure, the patient's blood pressure, pulse, and                            oxygen saturations were monitored continuously. The                            CF [id number] #[id number] was introduced through the anus                             and advanced to the 5 cm into the ileum. The                            colonoscopy was performed without difficulty. The                            patient tolerated the procedure. The quality of the                            bowel preparation was good. The terminal ileum,                            ileocecal valve, appendiceal orifice, and rectum                            were photographed. Scope In: 10:24:44 AM Scope Out: 10:37:05 AM Scope Withdrawal Time: 0 hours 9 minutes 48 seconds  Total Procedure Duration: 0 hours 12 minutes 21 seconds  Findings:                 The digital rectal exam findings include                            hemorrhoids. Pertinent negatives include no                            palpable rectal lesions.                           Skin tags were found on perianal exam.                           The left colon was moderately tortuous.                           The terminal ileum and ileocecal valve appeared                            normal.  Two sessile polyps were found in the ascending                            colon. The polyps were 6 to 8 mm in size. These                            polyps were removed with a cold snare. Resection                            and retrieval were complete.                           Normal mucosa was found in the entire colon                            otherwise.                           Non-bleeding non-thrombosed external and internal                            hemorrhoids were found during retroflexion, during                            perianal exam and during digital exam. The                            hemorrhoids were Grade II (internal hemorrhoids                            that prolapse but reduce spontaneously). Complications:            No immediate complications. Estimated Blood Loss:     Estimated blood loss was minimal. Impression:               - Hemorrhoids found on digital rectal  exam.                            Perianal skin tags found on perianal exam.                           - Tortuous left colon.                           - The examined portion of the ileum was normal.                           - Two 6 to 8 mm polyps in the ascending colon,                            removed with a cold snare. Resected and retrieved.                           - Normal mucosa in the entire examined colon  otherwise.                           - Non-bleeding non-thrombosed external and internal                            hemorrhoids. Recommendation:           - The patient will be observed post-procedure,                            until all discharge criteria are met.                           - Discharge patient to home.                           - Patient has a contact number available for                            emergencies. The signs and symptoms of potential                            delayed complications were discussed with the                            patient. Return to normal activities tomorrow.                            Written discharge instructions were provided to the                            patient.                           - High fiber diet.                           - Use FiberCon 1-2 tablets PO daily.                           - Continue present medications.                           - Await pathology results.                           - Repeat colonoscopy in 5 years for surveillance.                           - The findings and recommendations were discussed                            with the patient.                           - The findings and recommendations were discussed  with the patient's family. Justice Britain, MD 03/29/2022 10:42:42 AM

## 2022-03-29 NOTE — Progress Notes (Signed)
Called to room to assist during endoscopic procedure.  Patient ID and intended procedure confirmed with present staff. Received instructions for my participation in the procedure from the performing physician.  
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
## 2022-03-29 NOTE — Progress Notes (Unsigned)
Report to PACU, RN, vss, BBS= Clear.  

## 2022-03-29 NOTE — Patient Instructions (Addendum)
Handouts Provided:  Polyps and High fiber diet  - The patient will be observed post-procedure, until all discharge criteria are met. - Discharge patient to home. - Patient has a contact number available for emergencies. The signs and symptoms of potential delayed complications were discussed with the patient. Return to normal activities tomorrow. Written discharge instructions were provided to the patient. - High fiber diet. - Use FiberCon 1-2 tablets PO daily. - Continue present medications. - Await pathology results. - Repeat colonoscopy in 5 years for surveillance. - The findings and recommendations were discussed with the patient. - The findings and recommendations were discussed with the patient's family.  YOU HAD AN ENDOSCOPIC PROCEDURE TODAY AT [facility]:   Refer to the procedure report that was given to you for any specific questions about what was found during the examination.  If the procedure report does not answer your questions, please call your gastroenterologist to clarify.  If you requested that your care partner not be given the details of your procedure findings, then the procedure report has been included in a sealed envelope for you to review at your convenience later.  YOU SHOULD EXPECT: Some feelings of bloating in the abdomen. Passage of more gas than usual.  Walking can help get rid of the air that was put into your GI tract during the procedure and reduce the bloating. If you had a lower endoscopy (such as a colonoscopy or flexible sigmoidoscopy) you may notice spotting of blood in your stool or on the toilet paper. If you underwent a bowel prep for your procedure, you may not have a normal bowel movement for a few days.  Please Note:  You might notice some irritation and congestion in your nose or some drainage.  This is from the oxygen used during your procedure.  There is no need for concern and it should clear up in a day or so.  SYMPTOMS TO REPORT  IMMEDIATELY:  Following lower endoscopy (colonoscopy or flexible sigmoidoscopy):  Excessive amounts of blood in the stool  Significant tenderness or worsening of abdominal pains  Swelling of the abdomen that is new, acute  Fever of 100F or higher   For urgent or emergent issues, a gastroenterologist can be reached at any hour by calling [phone]. Do not use MyChart messaging for urgent concerns.    DIET:  We do recommend a small meal at first, but then you may proceed to your regular diet.  Drink plenty of fluids but you should avoid alcoholic beverages for 24 hours.  ACTIVITY:  You should plan to take it easy for the rest of today and you should NOT DRIVE or use heavy machinery until tomorrow (because of the sedation medicines used during the test).    FOLLOW UP: Our staff will call the number listed on your records the next business day following your procedure.  We will call around 7:15- 8:00 am to check on you and address any questions or concerns that you may have regarding the information given to you following your procedure. If we do not reach you, we will leave a message.     If any biopsies were taken you will be contacted by phone or by letter within the next 1-3 weeks.  Please call us at [phone] if you have not heard about the biopsies in 3 weeks.    SIGNATURES/CONFIDENTIALITY: You and/or your care partner have signed paperwork which will be entered into your electronic medical record.  These  signatures attest to the fact that that the information above on your After Visit Summary has been reviewed and is understood.  Full responsibility of the confidentiality of this discharge information lies with you and/or your care-partner.

## 2022-03-29 NOTE — Progress Notes (Unsigned)
Pt's states no medical or surgical changes since previsit or office visit. 

## 2022-03-30 ENCOUNTER — Telehealth: Payer: Self-pay | Admitting: *Deleted

## 2022-03-30 NOTE — Telephone Encounter (Signed)
  Follow up Call-     03/29/2022    9:51 AM  Call back number  Post procedure Call Back phone  # [phone]  Permission to leave phone message Yes     Patient questions:  Do you have a fever, pain , or abdominal swelling? No. Pain Score  0 *  Have you tolerated food without any problems? Yes.    Have you been able to return to your normal activities? Yes.    Do you have any questions about your discharge instructions: Diet   No. Medications  No. Follow up visit  No.  Do you have questions or concerns about your Care? No.  Actions: * If pain score is 4 or above: No action needed, pain <4.

## 2022-04-03 ENCOUNTER — Encounter: Payer: Self-pay | Admitting: Gastroenterology

## 2022-07-15 DIAGNOSIS — Z124 Encounter for screening for malignant neoplasm of cervix: Secondary | ICD-10-CM | POA: Diagnosis not present

## 2022-07-15 DIAGNOSIS — Z6830 Body mass index (BMI) 30.0-30.9, adult: Secondary | ICD-10-CM | POA: Diagnosis not present

## 2022-07-18 LAB — HM PAP SMEAR: HM Pap smear: NEGATIVE

## 2022-07-28 ENCOUNTER — Telehealth: Payer: Self-pay | Admitting: Family Medicine

## 2022-07-28 NOTE — Telephone Encounter (Signed)
Contacted Jacqulyn Ducking Eilts to schedule their annual wellness visit. Appointment made for 08/23/2022.  [place] Direct Dial: [phone]

## 2022-07-28 NOTE — Telephone Encounter (Signed)
Called patient to schedule Medicare Annual Wellness Visit (AWV). Left message for patient to call back and schedule Medicare Annual Wellness Visit (AWV).  Last date of AWV: 08/20/2021  Please schedule an appointment at any time with [facility].  If any questions, please contact me at [phone].  Thank you ,  [place] Direct Dial: [phone]

## 2022-08-08 ENCOUNTER — Other Ambulatory Visit: Payer: Self-pay | Admitting: Family Medicine

## 2022-08-12 DIAGNOSIS — M25512 Pain in left shoulder: Secondary | ICD-10-CM | POA: Diagnosis not present

## 2022-08-18 ENCOUNTER — Telehealth: Payer: Self-pay | Admitting: Family Medicine

## 2022-08-18 DIAGNOSIS — R739 Hyperglycemia, unspecified: Secondary | ICD-10-CM

## 2022-08-18 DIAGNOSIS — E039 Hypothyroidism, unspecified: Secondary | ICD-10-CM

## 2022-08-18 DIAGNOSIS — I10 Essential (primary) hypertension: Secondary | ICD-10-CM

## 2022-08-18 DIAGNOSIS — E559 Vitamin D deficiency, unspecified: Secondary | ICD-10-CM

## 2022-08-18 DIAGNOSIS — E7800 Pure hypercholesterolemia, unspecified: Secondary | ICD-10-CM

## 2022-08-18 NOTE — Telephone Encounter (Signed)
-----   Message from Alvina Chou sent at 07/27/2022 11:06 AM EDT ----- Regarding: Lab orders for Friday, 4.26.24 Patient is scheduled for CPX labs, please order future labs, Thanks , Camelia Eng

## 2022-08-19 ENCOUNTER — Other Ambulatory Visit: Payer: BC Managed Care – PPO

## 2022-08-23 ENCOUNTER — Ambulatory Visit (INDEPENDENT_AMBULATORY_CARE_PROVIDER_SITE_OTHER): Payer: PPO

## 2022-08-23 VITALS — Ht 64.0 in | Wt 174.0 lb

## 2022-08-23 DIAGNOSIS — Z0000 Encounter for general adult medical examination without abnormal findings: Secondary | ICD-10-CM
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
## 2022-08-23 NOTE — Patient Instructions (Signed)
Kaitlin Carter , Thank you for taking time to come for your Medicare Wellness Visit. I appreciate your ongoing commitment to your health goals. Please review the following plan we discussed and let me know if I can assist you in the future.   These are the goals we discussed:  Goals      Patient Stated     Begin low impact aerobics, lose weight        This is a list of the screening recommended for you and due dates:  Health Maintenance  Topic Date Due   COVID-19 Vaccine (4 - 2023-24 season) 12/24/2021   Flu Shot  11/24/2022   Medicare Annual Wellness Visit  08/23/2023   Mammogram  12/18/2023   Colon Cancer Screening  03/30/2027   DTaP/Tdap/Td vaccine (3 - Td or Tdap) 12/23/2027   Pneumonia Vaccine  Completed   DEXA scan (bone density measurement)  Completed   Hepatitis C Screening: USPSTF Recommendation to screen - Ages 36-79 yo.  Completed   Zoster (Shingles) Vaccine  Completed   HPV Vaccine  Aged Out    Advanced directives: Discussed with patient  Conditions/risks identified: Keep up the good work  Next appointment: Follow up in one year for your annual wellness visit:  08/28/23   Preventive Care 65 Years and Older, Female Preventive care refers to lifestyle choices and visits with your health care provider that can promote health and wellness. What does preventive care include? A yearly physical exam. This is also called an annual well check. Dental exams once or twice a year. Routine eye exams. Ask your health care provider how often you should have your eyes checked. Personal lifestyle choices, including: Daily care of your teeth and gums. Regular physical activity. Eating a healthy diet. Avoiding tobacco and drug use. Limiting alcohol use. Practicing safe sex. Taking low-dose aspirin every day. Taking vitamin and mineral supplements as recommended by your health care provider. What happens during an annual well check? The services and screenings done by your  health care provider during your annual well check will depend on your age, overall health, lifestyle risk factors, and family history of disease. Counseling  Your health care provider may ask you questions about your: Alcohol use. Tobacco use. Drug use. Emotional well-being. Home and relationship well-being. Sexual activity. Eating habits. History of falls. Memory and ability to understand (cognition). Work and work Astronomer. Reproductive health. Screening  You may have the following tests or measurements: Height, weight, and BMI. Blood pressure. Lipid and cholesterol levels. These may be checked every 5 years, or more frequently if you are over 17 years old. Skin check. Lung cancer screening. You may have this screening every year starting at age 53 if you have a 30-pack-year history of smoking and currently smoke or have quit within the past 15 years. Fecal occult blood test (FOBT) of the stool. You may have this test every year starting at age 46. Flexible sigmoidoscopy or colonoscopy. You may have a sigmoidoscopy every 5 years or a colonoscopy every 10 years starting at age 10. Hepatitis C blood test. Hepatitis B blood test. Sexually transmitted disease (STD) testing. Diabetes screening. This is done by checking your blood sugar (glucose) after you have not eaten for a while (fasting). You may have this done every 1-3 years. Bone density scan. This is done to screen for osteoporosis. You may have this done starting at age 49. Mammogram. This may be done every 1-2 years. Talk to your health care provider about  how often you should have regular mammograms. Talk with your health care provider about your test results, treatment options, and if necessary, the need for more tests. Vaccines  Your health care provider may recommend certain vaccines, such as: Influenza vaccine. This is recommended every year. Tetanus, diphtheria, and acellular pertussis (Tdap, Td) vaccine. You may  need a Td booster every 10 years. Zoster vaccine. You may need this after age 75. Pneumococcal 13-valent conjugate (PCV13) vaccine. One dose is recommended after age 17. Pneumococcal polysaccharide (PPSV23) vaccine. One dose is recommended after age 2. Talk to your health care provider about which screenings and vaccines you need and how often you need them. This information is not intended to replace advice given to you by your health care provider. Make sure you discuss any questions you have with your health care provider. Document Released: 05/08/2015 Document Revised: 12/30/2015 Document Reviewed: 02/10/2015 Elsevier Interactive Patient Education  2017 [place] Prevention in the Home Falls can cause injuries. They can happen to people of all ages. There are many things you can do to make your home safe and to help prevent falls. What can I do on the outside of my home? Regularly fix the edges of walkways and driveways and fix any cracks. Remove anything that might make you trip as you walk through a door, such as a raised step or threshold. Trim any bushes or trees on the path to your home. Use bright outdoor lighting. Clear any walking paths of anything that might make someone trip, such as rocks or tools. Regularly check to see if handrails are loose or broken. Make sure that both sides of any steps have handrails. Any raised decks and porches should have guardrails on the edges. Have any leaves, snow, or ice cleared regularly. Use sand or salt on walking paths during winter. Clean up any spills in your garage right away. This includes oil or grease spills. What can I do in the bathroom? Use night lights. Install grab bars by the toilet and in the tub and shower. Do not use towel bars as grab bars. Use non-skid mats or decals in the tub or shower. If you need to sit down in the shower, use a plastic, non-slip stool. Keep the floor dry. Clean up any water that spills on  the floor as soon as it happens. Remove soap buildup in the tub or shower regularly. Attach bath mats securely with double-sided non-slip rug tape. Do not have throw rugs and other things on the floor that can make you trip. What can I do in the bedroom? Use night lights. Make sure that you have a light by your bed that is easy to reach. Do not use any sheets or blankets that are too big for your bed. They should not hang down onto the floor. Have a firm chair that has side arms. You can use this for support while you get dressed. Do not have throw rugs and other things on the floor that can make you trip. What can I do in the kitchen? Clean up any spills right away. Avoid walking on wet floors. Keep items that you use a lot in easy-to-reach places. If you need to reach something above you, use a strong step stool that has a grab bar. Keep electrical cords out of the way. Do not use floor polish or wax that makes floors slippery. If you must use wax, use non-skid floor wax. Do not have throw rugs and other  things on the floor that can make you trip. What can I do with my stairs? Do not leave any items on the stairs. Make sure that there are handrails on both sides of the stairs and use them. Fix handrails that are broken or loose. Make sure that handrails are as long as the stairways. Check any carpeting to make sure that it is firmly attached to the stairs. Fix any carpet that is loose or worn. Avoid having throw rugs at the top or bottom of the stairs. If you do have throw rugs, attach them to the floor with carpet tape. Make sure that you have a light switch at the top of the stairs and the bottom of the stairs. If you do not have them, ask someone to add them for you. What else can I do to help prevent falls? Wear shoes that: Do not have high heels. Have rubber bottoms. Are comfortable and fit you well. Are closed at the toe. Do not wear sandals. If you use a stepladder: Make sure  that it is fully opened. Do not climb a closed stepladder. Make sure that both sides of the stepladder are locked into place. Ask someone to hold it for you, if possible. Clearly mark and make sure that you can see: Any grab bars or handrails. First and last steps. Where the edge of each step is. Use tools that help you move around (mobility aids) if they are needed. These include: Canes. Walkers. Scooters. Crutches. Turn on the lights when you go into a dark area. Replace any light bulbs as soon as they burn out. Set up your furniture so you have a clear path. Avoid moving your furniture around. If any of your floors are uneven, fix them. If there are any pets around you, be aware of where they are. Review your medicines with your doctor. Some medicines can make you feel dizzy. This can increase your chance of falling. Ask your doctor what other things that you can do to help prevent falls. This information is not intended to replace advice given to you by your health care provider. Make sure you discuss any questions you have with your health care provider. Document Released: 02/05/2009 Document Revised: 09/17/2015 Document Reviewed: 05/16/2014 Elsevier Interactive Patient Education  2017 [employer].

## 2022-08-24 ENCOUNTER — Telehealth: Payer: Self-pay | Admitting: Family Medicine

## 2022-08-24 DIAGNOSIS — E559 Vitamin D deficiency, unspecified: Secondary | ICD-10-CM

## 2022-08-24 DIAGNOSIS — I10 Essential (primary) hypertension: Secondary | ICD-10-CM

## 2022-08-24 DIAGNOSIS — R739 Hyperglycemia, unspecified: Secondary | ICD-10-CM

## 2022-08-24 DIAGNOSIS — E7800 Pure hypercholesterolemia, unspecified: Secondary | ICD-10-CM

## 2022-08-24 DIAGNOSIS — E039 Hypothyroidism, unspecified: Secondary | ICD-10-CM

## 2022-08-24 NOTE — Telephone Encounter (Signed)
-----   Message from Alvina Chou sent at 08/02/2022  3:58 PM EDT ----- Regarding: Lab orders for Friday, 5.3.24 Patient is scheduled for CPX labs, please order future labs, Thanks , Camelia Eng

## 2022-08-26 ENCOUNTER — Other Ambulatory Visit (INDEPENDENT_AMBULATORY_CARE_PROVIDER_SITE_OTHER): Payer: PPO

## 2022-08-26 ENCOUNTER — Encounter: Payer: BC Managed Care – PPO | Admitting: Family Medicine

## 2022-08-26 DIAGNOSIS — E7800 Pure hypercholesterolemia, unspecified: Secondary | ICD-10-CM

## 2022-08-26 DIAGNOSIS — I10 Essential (primary) hypertension: Secondary | ICD-10-CM

## 2022-08-26 DIAGNOSIS — E559 Vitamin D deficiency, unspecified: Secondary | ICD-10-CM

## 2022-08-26 DIAGNOSIS — R739 Hyperglycemia, unspecified: Secondary | ICD-10-CM

## 2022-08-26 DIAGNOSIS — E039 Hypothyroidism, unspecified: Secondary | ICD-10-CM | POA: Diagnosis not present

## 2022-08-26 LAB — LIPID PANEL
Cholesterol: 150 mg/dL (ref 0–200)
HDL: 50.4 mg/dL (ref >39.00)
LDL Cholesterol: 83 mg/dL (ref 0–99)
NonHDL: 99.18
Total CHOL/HDL Ratio: 3
Triglycerides: 80 mg/dL (ref 0.0–149.0)
VLDL: 16 mg/dL (ref 0.0–40.0)

## 2022-08-26 LAB — CBC WITH DIFFERENTIAL/PLATELET
Basophils Absolute: 0.1 10*3/uL (ref 0.0–0.1)
Basophils Relative: 0.8 % (ref 0.0–3.0)
Eosinophils Absolute: 0.2 10*3/uL (ref 0.0–0.7)
Eosinophils Relative: 2.7 % (ref 0.0–5.0)
HCT: 41.3 % (ref 36.0–46.0)
Hemoglobin: 14.1 g/dL (ref 12.0–15.0)
Lymphocytes Relative: 37.2 % (ref 12.0–46.0)
Lymphs Abs: 3.4 10*3/uL (ref 0.7–4.0)
MCHC: 34 g/dL (ref 30.0–36.0)
MCV: 93 fl (ref 78.0–100.0)
Monocytes Absolute: 0.8 10*3/uL (ref 0.1–1.0)
Monocytes Relative: 8.3 % (ref 3.0–12.0)
Neutro Abs: 4.7 10*3/uL (ref 1.4–7.7)
Neutrophils Relative %: 51 % (ref 43.0–77.0)
Platelets: 280 10*3/uL (ref 150.0–400.0)
RBC: 4.44 Mil/uL (ref 3.87–5.11)
RDW: 12.5 % (ref 11.5–15.5)
WBC: 9.2 10*3/uL (ref 4.0–10.5)

## 2022-08-26 LAB — COMPREHENSIVE METABOLIC PANEL
ALT: 28 U/L (ref 0–35)
AST: 28 U/L (ref 0–37)
Albumin: 4.1 g/dL (ref 3.5–5.2)
Alkaline Phosphatase: 61 U/L (ref 39–117)
BUN: 9 mg/dL (ref 6–23)
CO2: 32 mEq/L (ref 19–32)
Calcium: 9.2 mg/dL (ref 8.4–10.5)
Chloride: 101 mEq/L (ref 96–112)
Creatinine, Ser: 0.74 mg/dL (ref 0.40–1.20)
GFR: 84.35 mL/min (ref >60.00)
Glucose, Bld: 107 mg/dL — ABNORMAL HIGH (ref 70–99)
Potassium: 4.1 mEq/L (ref 3.5–5.1)
Sodium: 140 mEq/L (ref 135–145)
Total Bilirubin: 0.7 mg/dL (ref 0.2–1.2)
Total Protein: 7.1 g/dL (ref 6.0–8.3)

## 2022-08-26 LAB — HEMOGLOBIN A1C: Hgb A1c MFr Bld: 6.2 % (ref 4.6–6.5)

## 2022-08-26 LAB — TSH: TSH: 1.65 u[IU]/mL (ref 0.35–5.50)

## 2022-08-26 LAB — VITAMIN D 25 HYDROXY (VIT D DEFICIENCY, FRACTURES): VITD: 62.93 ng/mL (ref 30.00–100.00)

## 2022-09-02 ENCOUNTER — Encounter: Payer: Self-pay | Admitting: Family Medicine

## 2022-09-02 ENCOUNTER — Ambulatory Visit (INDEPENDENT_AMBULATORY_CARE_PROVIDER_SITE_OTHER): Payer: PPO | Admitting: Family Medicine

## 2022-09-02 VITALS — BP 126/84 | HR 71 | Temp 97.6°F | Ht 63.5 in | Wt 177.0 lb

## 2022-09-02 DIAGNOSIS — E039 Hypothyroidism, unspecified: Secondary | ICD-10-CM | POA: Diagnosis not present

## 2022-09-02 DIAGNOSIS — R739 Hyperglycemia, unspecified: Secondary | ICD-10-CM

## 2022-09-02 DIAGNOSIS — E669 Obesity, unspecified: Secondary | ICD-10-CM | POA: Diagnosis not present

## 2022-09-02 DIAGNOSIS — E559 Vitamin D deficiency, unspecified: Secondary | ICD-10-CM

## 2022-09-02 DIAGNOSIS — Z86010 Personal history of colonic polyps: Secondary | ICD-10-CM

## 2022-09-02 DIAGNOSIS — I10 Essential (primary) hypertension: Secondary | ICD-10-CM

## 2022-09-02 DIAGNOSIS — Z0000 Encounter for general adult medical examination without abnormal findings: Secondary | ICD-10-CM | POA: Diagnosis not present

## 2022-09-02 DIAGNOSIS — E7800 Pure hypercholesterolemia, unspecified: Secondary | ICD-10-CM | POA: Diagnosis not present

## 2022-09-02 NOTE — Assessment & Plan Note (Signed)
bp in fair control at this time  BP Readings from Last 1 Encounters:  09/02/22 126/84   No changes needed Most recent labs reviewed  Disc lifstyle change with low sodium diet and exercise  Plan to continue losartan 100 mg daily

## 2022-09-02 NOTE — Assessment & Plan Note (Signed)
Lab Results  Component Value Date   HGBA1C 6.2 08/26/2022   disc imp of low glycemic diet and wt loss to prevent DM2  Discussed cutting back ice cream

## 2022-09-02 NOTE — Assessment & Plan Note (Signed)
Discussed how this problem influences overall health and the risks it imposes  Reviewed plan for weight loss with lower calorie diet (via better food choices and also portion control or program like weight watchers) and exercise building up to or more than 30 minutes 5 days per week including some aerobic activity   Encouraged to keep doing strength training  Also work on lower glycemic diet

## 2022-09-02 NOTE — Assessment & Plan Note (Signed)
Reviewed health habits including diet and exercise and skin cancer prevention Reviewed appropriate screening tests for age  Also reviewed health mt list, fam hx and immunization status , as well as social and family history   See HPI Labs reviewed and ordered Mammogram 11/2021  Sees gyn-sent for last results  Colonoscopy 03/2022 with 5 y recall Dexa 05/2020 at gyn- was normal/ 5 y recall  No falls or fx and takes vit D , discussed more strength training PHQ score of 0

## 2022-09-02 NOTE — Assessment & Plan Note (Signed)
Vitamin D level is therapeutic with current supplementation Disc importance of this to bone and overall health Last vitamin D Lab Results  Component Value Date   VD25OH 62.93 08/26/2022    Will continue 5000 iu of D3 daily

## 2022-09-02 NOTE — Assessment & Plan Note (Signed)
Hypothyroidism  Pt has no clinical changes No change in energy level/ hair or skin/ edema and no tremor Lab Results  Component Value Date   TSH 1.65 08/26/2022    Conitnues 150 mg levothyroxine daily

## 2022-09-02 NOTE — Assessment & Plan Note (Signed)
Disc goals for lipids and reasons to control them Rev last labs with pt Rev low sat fat diet in detail  Well controlled with LDL of 83 and HDL in 50s Coronary ca score was 0 No symptoms   Does have fam h/o CAD
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
## 2022-09-02 NOTE — Patient Instructions (Addendum)
Keep up the exercise  Add some strength training to your routine, this is important for bone and brain health and can reduce your risk of falls and help your body use insulin properly and regulate weight  Light weights, exercise bands , and internet videos are a good way to start  Yoga (chair or regular), machines , floor exercises or a gym with machines are also good options    Try to get most of your carbohydrates from produce (with the exception of white potatoes)  Eat less bread/pasta/rice/snack foods/cereals/sweets and other items from the middle of the grocery store (processed carbs)  Try and eat less ice cream   Take care of yourself !

## 2022-09-02 NOTE — Assessment & Plan Note (Signed)
Colonoscopy 03/2022 with 5 y recall 

## 2022-09-13 ENCOUNTER — Encounter: Payer: Self-pay | Admitting: Family Medicine

## 2022-09-16 DIAGNOSIS — M25512 Pain in left shoulder: Secondary | ICD-10-CM | POA: Diagnosis not present

## 2022-09-27 ENCOUNTER — Other Ambulatory Visit: Payer: Self-pay | Admitting: Family Medicine

## 2022-09-29 ENCOUNTER — Other Ambulatory Visit: Payer: Self-pay | Admitting: Family Medicine

## 2022-11-15 ENCOUNTER — Other Ambulatory Visit: Payer: Self-pay | Admitting: Family Medicine

## 2022-11-15 DIAGNOSIS — Z1231 Encounter for screening mammogram for malignant neoplasm of breast: Secondary | ICD-10-CM

## 2022-12-23 ENCOUNTER — Ambulatory Visit: Payer: PPO

## 2022-12-30 ENCOUNTER — Ambulatory Visit
Admission: RE | Admit: 2022-12-30 | Discharge: 2022-12-30 | Disposition: A | Discharge status: Home / Self Care | Payer: PPO | Source: Ambulatory Visit | Attending: Family Medicine | Admitting: Family Medicine

## 2022-12-30 DIAGNOSIS — Z1231 Encounter for screening mammogram for malignant neoplasm of breast: Secondary | ICD-10-CM | POA: Diagnosis not present

## 2023-01-11 IMAGING — CT CT CARDIAC CORONARY ARTERY CALCIUM SCORE
3 series · 14 of 20 positions shown, 16 images · non-contrast
Comparison: None.

Addendum:
CLINICAL DATA: Cardiovascular Disease Risk stratification

EXAM:
Coronary Calcium Score
TECHNIQUE: A gated, non-contrast computed tomography scan of the heart was
performed using 3mm slice thickness. Axial images were analyzed on a
dedicated workstation. Calcium scoring of the coronary arteries was
performed using the Agatston method.

[Series 2: ax lung · axial · 0.86mm/px · z∈[+1150,+1262]mm · 5 of 86 slices shown]
[im 15/86  lung]
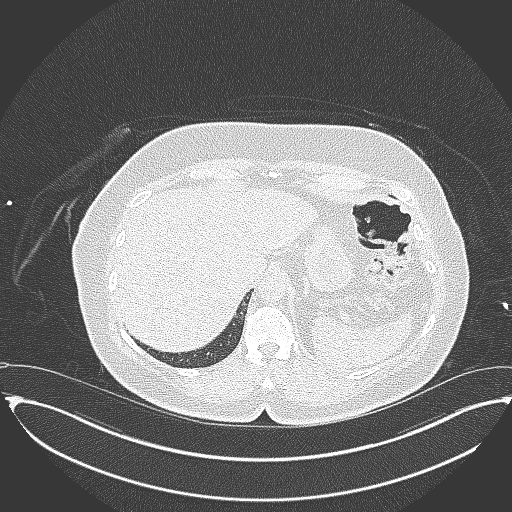
[im 29/86  lung]
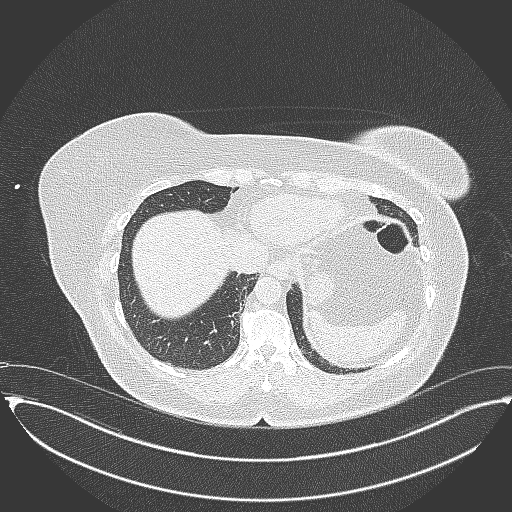
[im 43/86  lung]
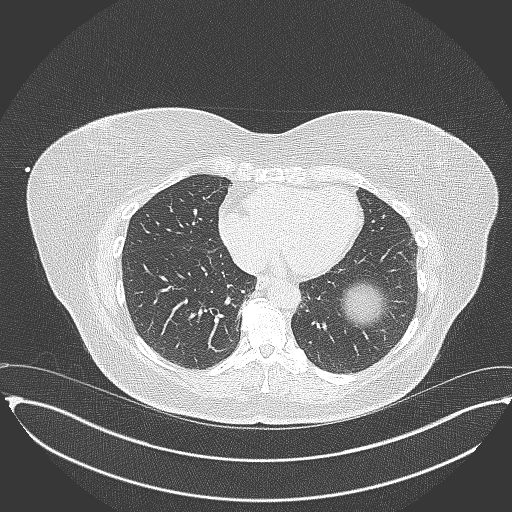
[im 57/86  lung]
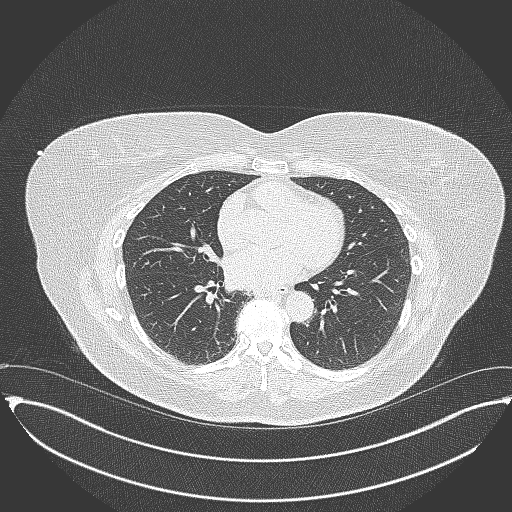
[im 71/86  lung]
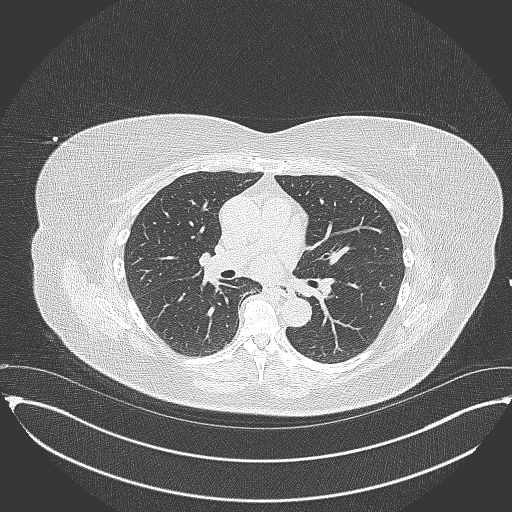

[Series 3: cascseq 3.0 sa36 70% (id) · axial · 0.36mm/px · z∈[+1165,+1249]mm · 3 of 57 slices shown]
[im 15/57  vessel]
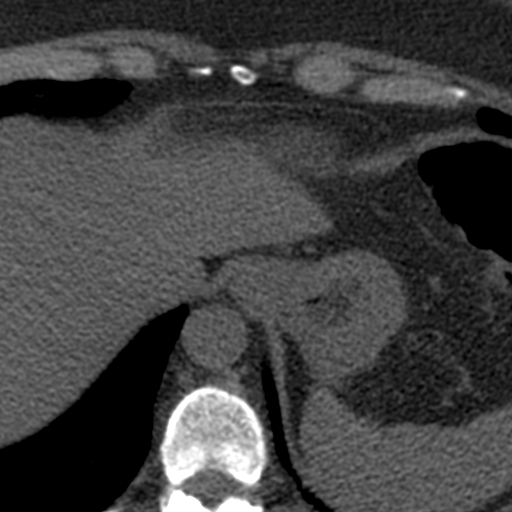
[im 29/57  vessel]
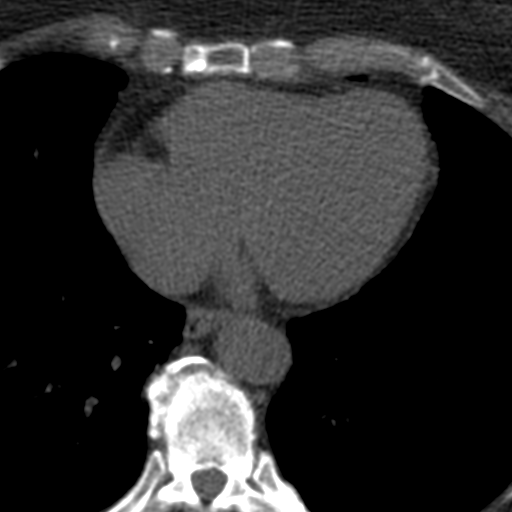
[im 43/57  vessel]
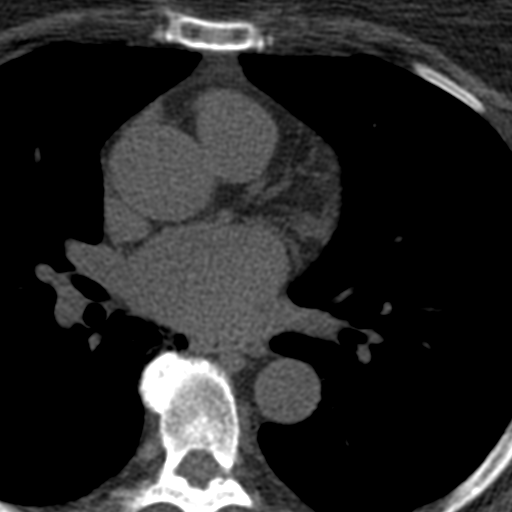

[Series 4: ax st · axial · 0.86mm/px · z∈[+1146,+1266]mm · 6 of 86 slices shown, 8 images]
[im 13/86  vessel]
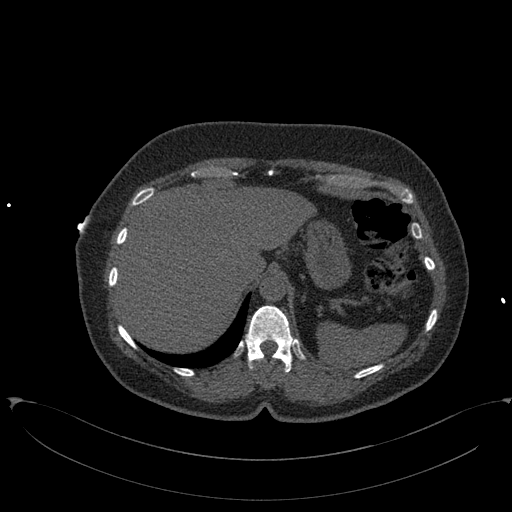
[im 13/86  lung]
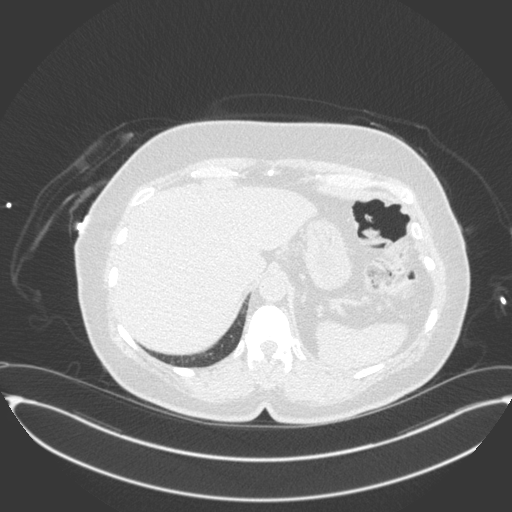
[im 25/86  vessel]
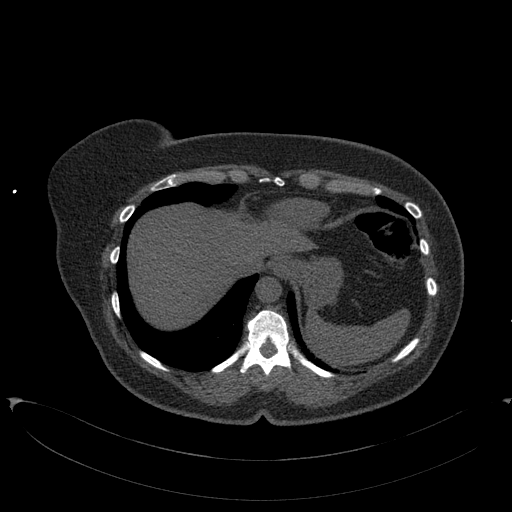
[im 37/86  vessel]
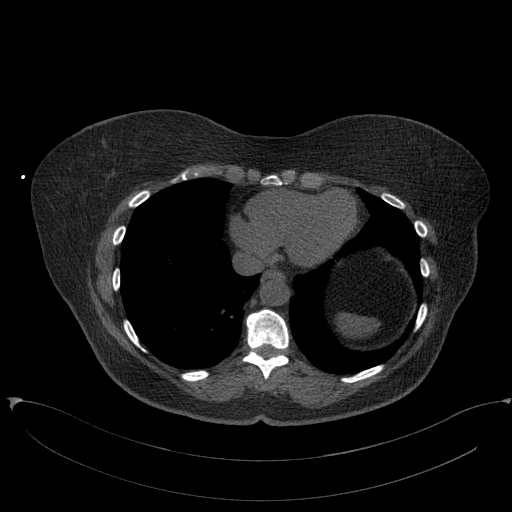
[im 49/86  vessel]
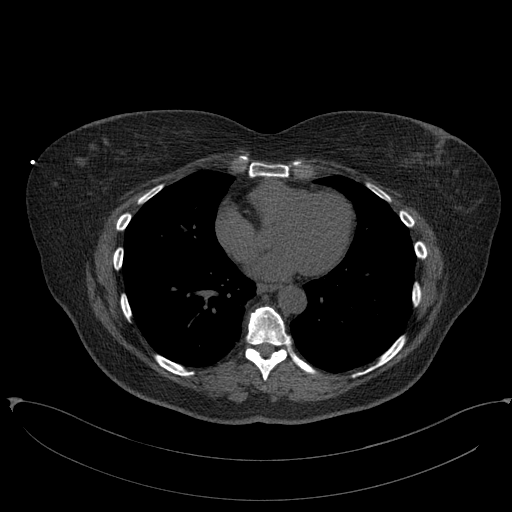
[im 61/86  vessel]
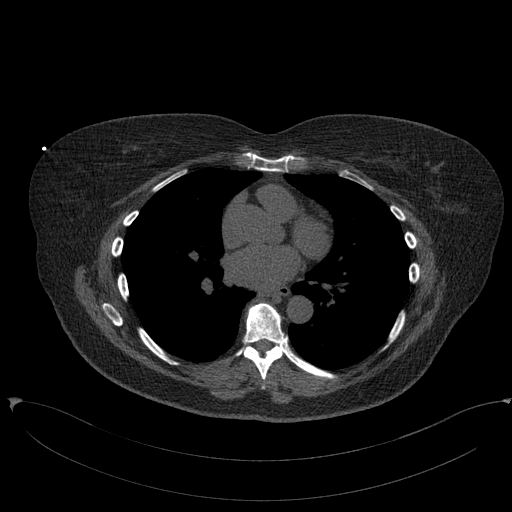
[im 61/86  lung]
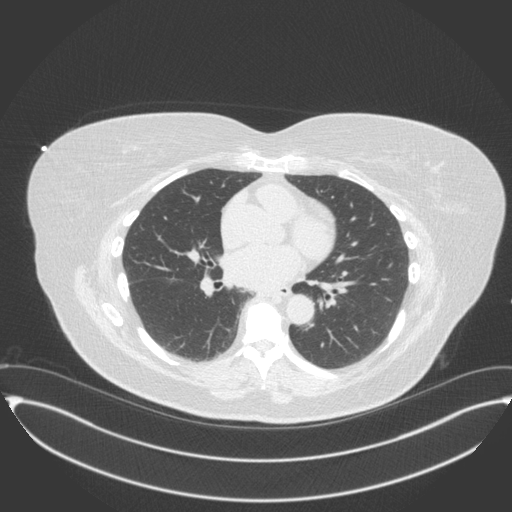
[im 73/86  vessel]
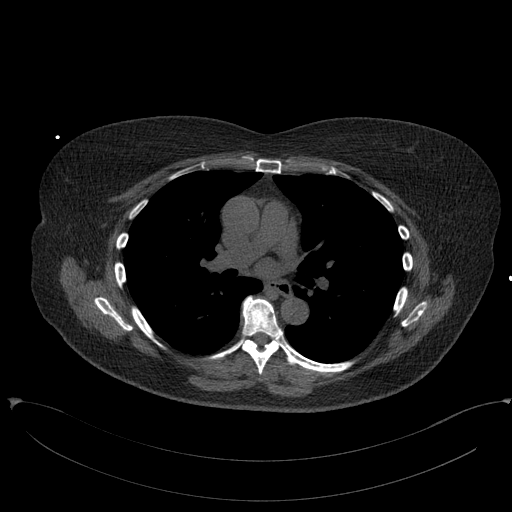

[14 of 20 positions shown; findings below may reference images not displayed]

FINDINGS: Coronary arteries: Normal origins.

Coronary Calcium Score:

Left main: 0

Left anterior descending artery: 0

Left circumflex artery: 0

Right coronary artery: 0

Total: 0

Percentile: 0

Pericardium: Normal.

Ascending Aorta: Normal caliber.

Non-cardiac: See separate report from [REDACTED].
IMPRESSION: Coronary calcium score of . This was percentile for age-, race-, and
sex-matched controls.



If CAC=0, it is reasonable to withhold statin therapy and reassess
in 5 to 10 years, as long as higher risk conditions are absent
(diabetes mellitus, family history of premature CHD in first degree
relatives (males <55 years; females <65 years), cigarette smoking,
or LDL >=190 mg/dL).

If CAC is 1 to 99, it is reasonable to initiate statin therapy for
patients >=55 years of age.

If CAC is >=100 or >=75th percentile, it is reasonable to initiate
statin therapy at any age.

Cardiology referral should be considered for patients with CAC
scores >=400 or >=75th percentile.

*0626 AHA/ACC/AACVPR/AAPA/ABC/BATSFORD/REGION/GIUSEPPE/[place]/MCGHEE/WILDE/ALINO
Guideline on the Management of Blood Cholesterol: A Report of the
American College of Cardiology/American Heart Association Task Force
on Clinical Practice Guidelines. J Am Coll Cardiol.
6861;73(24):7909-7300.

Jovanny Chi, DO

The noncardiac portion of this study will be interpreted in separate
report by the radiologist.

EXAM:
OVER-READ INTERPRETATION CARDIAC CT CHEST

The following report is an over-read performed by radiologist Dr.
Andry Bonin [REDACTED] on 10/05/2021. This
over-read does not include interpretation of cardiac or coronary
anatomy or pathology. The coronary calcium score interpretation by
the cardiologist is attached.
FINDINGS: Extracardiac Vascular: Unremarkable

Mediastinum: Unremarkable

Lung: Unremarkable

Included Upper Abdomen: Unremarkable

Musculoskeletal: Thoracic spondylosis.
IMPRESSION: 1. Thoracic spondylosis. Otherwise no significant extracardiac
findings.

*** End of Addendum ***
FINDINGS: Coronary arteries: Normal origins.

Coronary Calcium Score:

Left main: 0

Left anterior descending artery: 0

Left circumflex artery: 0

Right coronary artery: 0

Total: 0

Percentile: 0

Pericardium: Normal.

Ascending Aorta: Normal caliber.

Non-cardiac: See separate report from [REDACTED].
IMPRESSION: Coronary calcium score of . This was percentile for age-, race-, and
sex-matched controls.



If CAC=0, it is reasonable to withhold statin therapy and reassess
in 5 to 10 years, as long as higher risk conditions are absent
(diabetes mellitus, family history of premature CHD in first degree
relatives (males <55 years; females <65 years), cigarette smoking,
or LDL >=190 mg/dL).

If CAC is 1 to 99, it is reasonable to initiate statin therapy for
patients >=55 years of age.

If CAC is >=100 or >=75th percentile, it is reasonable to initiate
statin therapy at any age.

Cardiology referral should be considered for patients with CAC
scores >=400 or >=75th percentile.

*0626 AHA/ACC/AACVPR/AAPA/ABC/BATSFORD/REGION/GIUSEPPE/[place]ILDE/[place]INO
Guideline on the Management of Blood Cholesterol: A Report of the
American College of Cardiology/American Heart Association Task Force
on Clinical Practice Guidelines. J Am Coll Cardiol.
6861;73(24):7909-7300.

Jovanny Chi, DO

The noncardiac portion of this study will be interpreted in separate
report by the radiologist.

## 2023-02-24 DIAGNOSIS — M25512 Pain in left shoulder: Secondary | ICD-10-CM | POA: Diagnosis not present

## 2023-03-06 DIAGNOSIS — M25512 Pain in left shoulder: Secondary | ICD-10-CM | POA: Diagnosis not present

## 2023-03-17 DIAGNOSIS — M25512 Pain in left shoulder: Secondary | ICD-10-CM | POA: Diagnosis not present

## 2023-03-28 ENCOUNTER — Telehealth: Payer: Self-pay | Admitting: *Deleted

## 2023-03-28 NOTE — Telephone Encounter (Signed)
   Pre-operative Risk Assessment    Patient Name: Kaitlin Carter  DOB: [birth year] MRN: [id number]  DATE OF LAST VISIT: 09/03/21 DR. SMITH DATE OF NEXT VISIT: NONE    Request for Surgical Clearance    Procedure:   RIGHT SHOULDER ARTHROSCOPY  Date of Surgery:  Clearance 05/25/23                                 Surgeon:  DR. Marcene Corning Surgeon's Group or Practice Name:  [facility] Phone number:  [phone] ATTN: REBECCA LANG Fax number:  [phone]   Type of Clearance Requested:   - Medical  - Pharmacy:  Hold Aspirin     Type of Anesthesia:   CHOICE   Additional requests/questions:    Elpidio Anis   03/28/2023, 5:50 PM

## 2023-03-29 NOTE — Telephone Encounter (Signed)
Patient has not been seen by our office since 08/2021. Therefore, she will require an in-person office visit in order for [place] to provide cardiac risk assessment. She already has a visit scheduled for 04/27/2023 at 3:35pm with Kandyce Rud, NP for pre-op evaluation. Surgery is not scheduled until 05/25/2023.  I will route this note to [facility] so that she is aware.   It looks like she only takes Aspirin twice a week and it looks like this is for a non-cardiac reason. Her coronary calcium score was 0 in 09/2021. Therefore, OK to hold Aspirin from a cardiac standpoint.  I will route this to Dr. Nolon Nations office so that they are aware that full pre-op risk assessment will come after office visit on 04/27/2023.  I will remove from pre-op pool.Kaitlin Parker, PA-C 03/29/2023 7:14 AM

## 2023-04-26 NOTE — Progress Notes (Signed)
 Cardiology Office Note    Date:  04/27/2023  ID:  [name], DOB 07/05/1955, MRN [id number] PCP:  Randeen Laine LABOR, MD  Cardiologist:  Victory LELON Claudene DOUGLAS, MD (Inactive)  Electrophysiologist:  None   Chief Complaint: Preoperative cardiac evaluation   History of Present Illness: .    Kaitlin Carter is a 68 y.o. female with visit-pertinent history of left bundle branch block in 2012, hyperlipidemia, primary hypertension and family history of CAD.   Ms. True was last seen by Dr. Claudene on 09/03/2021.  She presented for evaluation as her family has a significant history of CAD.  Her brother underwent bypass surgery in his mid 54s after being diagnosed with atrial fibrillation.  Her father died from myocardial infarction in his late 59s, her mother has no cardiac history.  She had previously been seen by Dr. Rolan in 2012 for left bundle branch block and had evaluation including a nuclear study that was unremarkable.  On 10/04/2021 she underwent CT cardiac scoring that indicated a coronary calcium  score of 0.  Today she presents for preoperative cardiac evaluation for a right shoulder arthroscopy with Dr. Maude Herald on 05/25/23.  She reports that she is doing very well, she has no cardiac concerns or complaints today.  She reports that she recently retired at the end of the year and is planning to do more walking this year.  She denies chest pain, shortness of breath, lower extremity edema, palpitations, presyncope, syncope, orthopnea or PND.  She is able to exceed 4 METS of activity without anginal symptoms.  Labwork independently reviewed: 08/26/2022: Sodium 140, potassium 4.1, creatinine 0.74 AST 28, ALT 28 ROS: .   Today she denies chest pain, shortness of breath, lower extremity edema, fatigue, palpitations, melena, hematuria, hemoptysis, diaphoresis, weakness, presyncope, syncope, orthopnea, and PND.  All other systems are reviewed and otherwise negative. Studies Reviewed: SABRA    EKG:   EKG is ordered today, personally reviewed, demonstrating  EKG Interpretation Date/Time:  Thursday April 27 2023 15:41:42 EST Ventricular Rate:  84 PR Interval:  158 QRS Duration:  78 QT Interval:  384 QTC Calculation: 453 R Axis:   6  Text Interpretation: Normal sinus rhythm Normal ECG Confirmed by Rhyleigh Grassel [id number]) on 04/27/2023 3:53:05 PM   CV Studies:  Cardiac Studies & Procedures         CT SCANS  CT CARDIAC SCORING (SELF PAY ONLY) 10/04/2021  Addendum 10/05/2021  9:06 AM ADDENDUM REPORT: 10/05/2021 09:03  EXAM: OVER-READ INTERPRETATION CARDIAC CT CHEST  The following report is an over-read performed by radiologist Dr. Ryan Salvage of [facility] on 10/05/2021. This over-read does not include interpretation of cardiac or coronary anatomy or pathology. The coronary calcium  score interpretation by the cardiologist is attached.  COMPARISON:  None.  FINDINGS: Extracardiac Vascular: Unremarkable  Mediastinum: Unremarkable  Lung: Unremarkable  Included Upper Abdomen: Unremarkable  Musculoskeletal: Thoracic spondylosis.  IMPRESSION: 1. Thoracic spondylosis. Otherwise no significant extracardiac findings.   Electronically Signed By: Ryan Salvage M.D. On: 10/05/2021 09:03  Narrative CLINICAL DATA:  Cardiovascular Disease Risk stratification  EXAM: Coronary Calcium  Score  TECHNIQUE: A gated, non-contrast computed tomography scan of the heart was performed using 3mm slice thickness. Axial images were analyzed on a dedicated workstation. Calcium  scoring of the coronary arteries was performed using the Agatston method.  FINDINGS: Coronary arteries: Normal origins.  Coronary Calcium  Score:  Left main: 0  Left anterior descending artery: 0  Left circumflex artery: 0  Right  coronary artery: 0  Total: 0  Percentile: 0  Pericardium: Normal.  Ascending Aorta: Normal caliber.  Non-cardiac: See separate report from [facility].  IMPRESSION: Coronary calcium  score of . This was percentile for age-, race-, and sex-matched controls.  RECOMMENDATIONS: Coronary artery calcium  (CAC) score is a strong predictor of incident coronary heart disease (CHD) and provides predictive information beyond traditional risk factors. CAC scoring is reasonable to use in the decision to withhold, postpone, or initiate statin therapy in intermediate-risk or selected borderline-risk asymptomatic adults (age 72-75 years and LDL-C >=70 to <190 mg/dL) who do not have diabetes or established atherosclerotic cardiovascular disease (ASCVD).* In intermediate-risk (10-year ASCVD risk >=7.5% to <20%) adults or selected borderline-risk (10-year ASCVD risk >=5% to <7.5%) adults in whom a CAC score is measured for the purpose of making a treatment decision the following recommendations have been made:  If CAC=0, it is reasonable to withhold statin therapy and reassess in 5 to 10 years, as long as higher risk conditions are absent (diabetes mellitus, family history of premature CHD in first degree relatives (males <55 years; females <65 years), cigarette smoking, or LDL >=190 mg/dL).  If CAC is 1 to 99, it is reasonable to initiate statin therapy for patients >=81 years of age.  If CAC is >=100 or >=75th percentile, it is reasonable to initiate statin therapy at any age.  Cardiology referral should be considered for patients with CAC scores >=400 or >=75th percentile.  *2018 AHA/ACC/AACVPR/AAPA/ABC/ACPM/ADA/AGS/APhA/ASPC/NLA/PCNA Guideline on the Management of Blood Cholesterol: A Report of the American College of Cardiology/American Heart Association Task Force on Clinical Practice Guidelines. J Am Coll Cardiol. 2019;73(24):3168-3209.  Kardie Tobb, DO  The noncardiac portion of this study will be interpreted in separate report by the radiologist.  Electronically Signed: By: Kardie  Tobb D.O. On: 10/04/2021 21:28           Current Reported Medications:.    Current Meds  Medication Sig   aspirin EC 81 MG tablet Take 81 mg by mouth 2 (two) times a week.    Cholecalciferol (VITAMIN D3) 1.25 MG (50000 UT) TABS Take 1 tablet by mouth daily.   Coenzyme Q10 200 MG TABS Take by mouth.   levothyroxine  (SYNTHROID ) 150 MCG tablet TAKE ONE-HALF TABLET BY MOUTH ON MONDAY WEDNESDAY FRIDAY AND SUNDAY THEN 1 TABLET BY MOUTH ON TUESDAY THURSDAY AND SATURDAY   losartan  (COZAAR ) 100 MG tablet TAKE 1 TABLET BY MOUTH EVERY DAY   Magnesium 400 MG CAPS Take 1 capsule by mouth every other day.   Multiple Vitamin (MULTIVITAMIN) tablet Take 1 tablet by mouth daily.   Physical Exam:    VS:  BP 115/80 (BP Location: Right Arm, Patient Position: Sitting, Cuff Size: Normal)   Pulse 84   Ht 5' 4 (1.626 m)   Wt 171 lb (77.6 kg)   SpO2 97%   BMI 29.35 kg/m    Wt Readings from Last 3 Encounters:  04/27/23 171 lb (77.6 kg)  09/02/22 177 lb (80.3 kg)  08/23/22 174 lb (78.9 kg)    GEN: Well nourished, well developed in no acute distress NECK: No JVD; No carotid bruits CARDIAC: RRR, no murmurs, rubs, gallops RESPIRATORY:  Clear to auscultation without rales, wheezing or rhonchi  ABDOMEN: Soft, non-tender, non-distended EXTREMITIES:  No edema; No acute deformity   Asessement and Plan:.    Preoperative cardiac evaluation: Right shoulder arthroscopy on 05/25/23 with Dr. Maude Herald.  Ms. Samet perioperative risk of a major cardiac event is 0.4% according  to the Revised Cardiac Risk Index (RCRI).  Therefore, she is at low risk for perioperative complications.   Her functional capacity is good at 8.23 METs according to the Duke Activity Status Index (DASI). Recommendations: According to ACC/AHA guidelines, no further cardiovascular testing needed.  The patient may proceed to surgery at acceptable risk.   Antiplatelet and/or Anticoagulation Recommendations: Aspirin can be held for 7 days prior to her surgery.  Please resume Aspirin  post operatively when it is felt to be safe from a bleeding standpoint.   Hypertension: Blood pressure today 115/80. Monitored and managed per PCP. Continue Losartan  100 mg daily.   Hyperlipidemia: Last lipid profile on 08/26/2022 indicated total cholesterol 150, triglycerides 80, HDL 50.4 and LDL 83.  Monitored and managed per PCP.    Disposition: F/u with Bethlehem Langstaff, NP or new provider once selected in one year.   Signed, Chassidy Layson D Hayleen Clinkscales, NP

## 2023-04-27 ENCOUNTER — Ambulatory Visit: Payer: PPO | Attending: Cardiology | Admitting: Cardiology

## 2023-04-27 ENCOUNTER — Encounter: Payer: Self-pay | Admitting: Cardiology

## 2023-04-27 VITALS — BP 115/80 | HR 84 | Ht 64.0 in | Wt 171.0 lb

## 2023-04-27 DIAGNOSIS — I10 Essential (primary) hypertension: Secondary | ICD-10-CM | POA: Diagnosis not present

## 2023-04-27 DIAGNOSIS — Z8249 Family history of ischemic heart disease and other diseases of the circulatory system: Secondary | ICD-10-CM | POA: Diagnosis not present

## 2023-04-27 DIAGNOSIS — Z01810 Encounter for preprocedural cardiovascular examination: Secondary | ICD-10-CM

## 2023-04-27 DIAGNOSIS — E7800 Pure hypercholesterolemia, unspecified: Secondary | ICD-10-CM

## 2023-04-27 NOTE — Patient Instructions (Signed)
 Medication Instructions:  No changes *If you need a refill on your cardiac medications before your next appointment, please call your pharmacy*  Lab Work: No labs If you have labs (blood work) drawn today and your tests are completely normal, you will receive your results only by: MyChart Message (if you have MyChart) OR A paper copy in the mail If you have any lab test that is abnormal or we need to change your treatment, we will call you to review the results.  Testing/Procedures: No testing  Follow-Up: At [facility], you and your health needs are our priority.  As part of our continuing mission to provide you with exceptional heart care, we have created designated Provider Care Teams.  These Care Teams include your primary Cardiologist (physician) and Advanced Practice Providers (APPs -  Physician Assistants and Nurse Practitioners) who all work together to provide you with the care you need, when you need it.  We recommend signing up for the patient portal called MyChart.  Sign up information is provided on this After Visit Summary.  MyChart is used to connect with patients for Virtual Visits (Telemedicine).  Patients are able to view lab/test results, encounter notes, upcoming appointments, etc.  Non-urgent messages can be sent to your provider as well.   To learn more about what you can do with MyChart, go to [email or web address].    Your next appointment:   1 year(s)  Provider:   Dr Lonni Nanas, Dr Darryle Decent, Dr Dub Huntsman, or Dr Soyla Merck

## 2023-05-25 DIAGNOSIS — M24612 Ankylosis, left shoulder: Secondary | ICD-10-CM | POA: Diagnosis not present

## 2023-05-25 DIAGNOSIS — M7552 Bursitis of left shoulder: Secondary | ICD-10-CM | POA: Diagnosis not present

## 2023-05-25 DIAGNOSIS — M75112 Incomplete rotator cuff tear or rupture of left shoulder, not specified as traumatic: Secondary | ICD-10-CM | POA: Diagnosis not present

## 2023-05-25 DIAGNOSIS — G8918 Other acute postprocedural pain: Secondary | ICD-10-CM | POA: Diagnosis not present

## 2023-06-08 DIAGNOSIS — M25612 Stiffness of left shoulder, not elsewhere classified: Secondary | ICD-10-CM | POA: Diagnosis not present

## 2023-06-13 DIAGNOSIS — M25612 Stiffness of left shoulder, not elsewhere classified: Secondary | ICD-10-CM | POA: Diagnosis not present

## 2023-06-20 DIAGNOSIS — M25612 Stiffness of left shoulder, not elsewhere classified: Secondary | ICD-10-CM | POA: Diagnosis not present

## 2023-06-22 DIAGNOSIS — M25612 Stiffness of left shoulder, not elsewhere classified: Secondary | ICD-10-CM | POA: Diagnosis not present

## 2023-06-27 ENCOUNTER — Encounter: Payer: Self-pay | Admitting: Family Medicine

## 2023-06-27 ENCOUNTER — Ambulatory Visit (INDEPENDENT_AMBULATORY_CARE_PROVIDER_SITE_OTHER): Payer: PPO | Admitting: Family Medicine

## 2023-06-27 VITALS — BP 130/64 | HR 67 | Temp 98.2°F | Ht 64.0 in | Wt 166.4 lb

## 2023-06-27 DIAGNOSIS — I10 Essential (primary) hypertension: Secondary | ICD-10-CM

## 2023-06-27 DIAGNOSIS — E039 Hypothyroidism, unspecified: Secondary | ICD-10-CM

## 2023-06-27 DIAGNOSIS — R251 Tremor, unspecified: Secondary | ICD-10-CM | POA: Diagnosis not present

## 2023-06-27 DIAGNOSIS — E559 Vitamin D deficiency, unspecified: Secondary | ICD-10-CM | POA: Diagnosis not present

## 2023-06-27 DIAGNOSIS — R739 Hyperglycemia, unspecified: Secondary | ICD-10-CM | POA: Diagnosis not present

## 2023-06-27 DIAGNOSIS — M25612 Stiffness of left shoulder, not elsewhere classified: Secondary | ICD-10-CM | POA: Diagnosis not present

## 2023-06-27 LAB — COMPREHENSIVE METABOLIC PANEL
ALT: 17 U/L (ref 0–35)
AST: 20 U/L (ref 0–37)
Albumin: 4.3 g/dL (ref 3.5–5.2)
Alkaline Phosphatase: 59 U/L (ref 39–117)
BUN: 13 mg/dL (ref 6–23)
CO2: 31 meq/L (ref 19–32)
Calcium: 9.5 mg/dL (ref 8.4–10.5)
Chloride: 102 meq/L (ref 96–112)
Creatinine, Ser: 0.73 mg/dL (ref 0.40–1.20)
GFR: 85.24 mL/min (ref >60.00)
Glucose, Bld: 109 mg/dL — ABNORMAL HIGH (ref 70–99)
Potassium: 4 meq/L (ref 3.5–5.1)
Sodium: 140 meq/L (ref 135–145)
Total Bilirubin: 0.6 mg/dL (ref 0.2–1.2)
Total Protein: 7.1 g/dL (ref 6.0–8.3)

## 2023-06-27 LAB — CBC WITH DIFFERENTIAL/PLATELET
Basophils Absolute: 0 10*3/uL (ref 0.0–0.1)
Basophils Relative: 0.6 % (ref 0.0–3.0)
Eosinophils Absolute: 0.4 10*3/uL (ref 0.0–0.7)
Eosinophils Relative: 4.5 % (ref 0.0–5.0)
HCT: 42.7 % (ref 36.0–46.0)
Hemoglobin: 14.5 g/dL (ref 12.0–15.0)
Lymphocytes Relative: 38.3 % (ref 12.0–46.0)
Lymphs Abs: 3.2 10*3/uL (ref 0.7–4.0)
MCHC: 34 g/dL (ref 30.0–36.0)
MCV: 94.4 fl (ref 78.0–100.0)
Monocytes Absolute: 0.7 10*3/uL (ref 0.1–1.0)
Monocytes Relative: 8.3 % (ref 3.0–12.0)
Neutro Abs: 4.1 10*3/uL (ref 1.4–7.7)
Neutrophils Relative %: 48.3 % (ref 43.0–77.0)
Platelets: 285 10*3/uL (ref 150.0–400.0)
RBC: 4.53 Mil/uL (ref 3.87–5.11)
RDW: 12.6 % (ref 11.5–15.5)
WBC: 8.4 10*3/uL (ref 4.0–10.5)

## 2023-06-27 LAB — VITAMIN D 25 HYDROXY (VIT D DEFICIENCY, FRACTURES): VITD: 81.18 ng/mL (ref 30.00–100.00)

## 2023-06-27 LAB — TSH: TSH: 1.62 u[IU]/mL (ref 0.35–5.50)

## 2023-06-27 LAB — HEMOGLOBIN A1C: Hgb A1c MFr Bld: 5.9 % (ref 4.6–6.5)

## 2023-06-27 NOTE — Assessment & Plan Note (Signed)
 TSH today  Takes levothyroxine 150 mcg daily   Tremor recently  More hair loss and dry skin

## 2023-06-27 NOTE — Assessment & Plan Note (Signed)
 Left hand and lower leg At rest more than intention  ? Early bradykinesia   In setting of recent rot cuff surg causing left arm weakness / in PT and improving Also stress-mother in hospice for dementia   Lab today  Then likely neuro referral / pt pref [facility]

## 2023-06-27 NOTE — Patient Instructions (Addendum)
 Labs today   Avoid excessive caffeine   We will likely refer you to neurology after labs return for further evaluation (in [place] )_  If you want some mental health counseling for stress let us know

## 2023-06-27 NOTE — Addendum Note (Signed)
 Addended by: Roxy Manns A on: 06/27/2023 07:46 PM   Modules accepted: Orders
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
## 2023-06-27 NOTE — Assessment & Plan Note (Signed)
 Oral supplementation  D level today

## 2023-06-27 NOTE — Assessment & Plan Note (Signed)
 A1c today   Had intentional weight loss

## 2023-06-27 NOTE — Assessment & Plan Note (Signed)
 bp in fair control at this time  BP Readings from Last 1 Encounters:  06/27/23 130/64   No changes needed Most recent labs reviewed  Disc lifstyle change with low sodium diet and exercise  Plan to continue losartan 100 mg daily''  Commended intentional weight loss

## 2023-06-29 DIAGNOSIS — M25612 Stiffness of left shoulder, not elsewhere classified: Secondary | ICD-10-CM | POA: Diagnosis not present

## 2023-07-04 DIAGNOSIS — M25612 Stiffness of left shoulder, not elsewhere classified: Secondary | ICD-10-CM | POA: Diagnosis not present

## 2023-07-10 NOTE — Telephone Encounter (Signed)
 Please see pt info re: neuro referral and patient's preference    Thanks!

## 2023-07-10 NOTE — Telephone Encounter (Unsigned)
 Copied from CRM [id number]. Topic: Referral - Status >> Jul 10, 2023  9:34 AM Gurney Maxin H wrote: Reason for CRM: Patient calling to check status of Neurology referral, patient states she would like to see Dr. Loleta Chance at the [facility] location,  [place] [phone]

## 2023-07-11 ENCOUNTER — Encounter: Payer: Self-pay | Admitting: *Deleted

## 2023-07-11 ENCOUNTER — Telehealth: Payer: Self-pay | Admitting: Family Medicine

## 2023-07-11 ENCOUNTER — Encounter: Payer: Self-pay | Admitting: Neurology

## 2023-07-11 NOTE — Telephone Encounter (Signed)
 Patient is scheduled with [facility] this month.   Nothing further needed.

## 2023-07-11 NOTE — Telephone Encounter (Signed)
 Will route to referral dpt

## 2023-07-11 NOTE — Telephone Encounter (Signed)
 Referral sent to [facility] to process and contact patient to schedule.

## 2023-07-11 NOTE — Telephone Encounter (Signed)
 Copied from CRM [id number]. Topic: Referral - Status >> Jul 11, 2023  8:10 AM Martinique E wrote: Reason for CRM: Patient called in questioning when her neurology referral will be authorized. Was able to tell patient that the status was "pending," she just wants to know when she will be getting a call to set something, when it has already been two weeks.

## 2023-07-11 NOTE — Telephone Encounter (Signed)
 MyChart message to Patient  Neurology Referral -   07/11/23 11:15 AM  Yes I can send to [facility].    I have sent this referral over to their office to review.   [facility] [address] [place], [place] [address] Phone: [phone]   Let us know if you have any questions.    Nadara Eaton, CMA Referral Coordinator

## 2023-07-19 NOTE — Progress Notes (Unsigned)
 Assessment/Plan:   ***  Subjective:   Kaitlin Carter was seen today in the movement disorders clinic for neurologic consultation at the request of Tower, Audrie Gallus, MD.  The consultation is for the evaluation of tremor.  Records made available to me are reviewed.  Notes indicate that tremor started in the left hand and is now on the left leg as well.  She was referred for further evaluation.  Tremor: {yes no:314532}   How long has it been going on? ***  At rest or with activation?  ***  When is it noted the most?  ***  Fam hx of tremor?  {yes WU:981191}  Located where?  ***Left hand and leg.  Affected by caffeine:  {yes no:314532}  Affected by alcohol:  {yes no:314532}  Affected by stress:  {yes no:314532}  Affected by fatigue:  {yes no:314532}  Spills soup if on spoon:  {yes no:314532}  Spills glass of liquid if full:  {yes no:314532}  Affects ADL's (tying shoes, brushing teeth, etc):  {yes no:314532}  Tremor inducing meds:  {yes no:314532}  Other Specific Symptoms:  Voice: *** Sleep: ***  Vivid Dreams:  {yes no:314532}  Acting out dreams:  {yes no:314532} Wet Pillows: {yes no:314532} Postural symptoms:  {yes no:314532}  Falls?  {yes no:314532} Bradykinesia symptoms: {parkinson brady:18041} Loss of smell:  {yes no:314532} Loss of taste:  {yes no:314532} Urinary Incontinence:  {yes no:314532} Difficulty Swallowing:  {yes no:314532} Handwriting, micrographia: {yes no:314532} Trouble with ADL's:  {yes no:314532}  Trouble buttoning clothing: {yes no:314532} Depression:  {yes no:314532} Memory changes:  {yes no:314532} Hallucinations:  {yes no:314532}  visual distortions: {yes no:314532} N/V:  {yes no:314532} Lightheaded:  {yes no:314532}  Syncope: {yes no:314532} Diplopia:  {yes no:314532} Dyskinesia:  {yes no:314532}  Neuroimaging of the brain has not previously been performed.  It *** available for my review today.  PREVIOUS MEDICATIONS: {Parkinson's  RX:18200}  ALLERGIES:   Allergies  Allergen Reactions   Atorvastatin Other (See Comments)    Mild muscle aches    Verapamil     ineffective   Amlodipine Besy-Benazepril Hcl     cough   Hydrochlorothiazide W-Triamterene     ? Induced gout    CURRENT MEDICATIONS:  Current Outpatient Medications  Medication Instructions   aspirin EC 81 mg, Daily   Cholecalciferol (VITAMIN D3) 1.25 MG (50000 UT) TABS 1 tablet, Daily   Coenzyme Q10 200 MG TABS Take by mouth.   levothyroxine (SYNTHROID) 150 MCG tablet TAKE ONE-HALF TABLET BY MOUTH ON MONDAY WEDNESDAY FRIDAY AND SUNDAY THEN 1 TABLET BY MOUTH ON TUESDAY THURSDAY AND SATURDAY   losartan (COZAAR) 100 mg, Oral, Daily   Magnesium 400 MG CAPS 1 capsule, Every other day   Multiple Vitamin (MULTIVITAMIN) tablet 1 tablet, Daily    Objective:   PHYSICAL EXAMINATION:    VITALS:  There were no vitals filed for this visit.  GEN:  The patient appears stated age and is in NAD. HEENT:  Normocephalic, atraumatic.  The mucous membranes are moist. The superficial temporal arteries are without ropiness or tenderness. CV:  RRR Lungs:  CTAB Neck/HEME:  There are no carotid bruits bilaterally.  Neurological examination:  Orientation: The patient is alert and oriented x3.  Cranial nerves: There is good facial symmetry.  Extraocular muscles are intact. The visual fields are full to confrontational testing. The speech is fluent and clear. Soft palate rises symmetrically and there is no tongue deviation. Hearing is intact to conversational tone. Sensation: Sensation is intact to  light touch throughout (facial, trunk, extremities). Vibration is intact at the bilateral big toe. There is no extinction with double simultaneous stimulation.  Motor: Strength is 5/5 in the bilateral upper and lower extremities.   Shoulder shrug is equal and symmetric.  There is no pronator drift. Deep tendon reflexes: Deep tendon reflexes are 2/4 at the bilateral biceps,  triceps, brachioradialis, patella and achilles. Plantar responses are downgoing bilaterally.  Movement examination: Tone: There is ***tone in the bilateral upper extremities.  The tone in the lower extremities is ***.  Abnormal movements: *** Coordination:  There is *** decremation with RAM's, *** Gait and Station: The patient has *** difficulty arising out of a deep-seated chair without the use of the hands. The patient's stride length is ***.  The patient has a *** pull test.     I have reviewed and interpreted the following labs independently   Chemistry      Component Value Date/Time   NA 140 06/27/2023 0823   K 4.0 06/27/2023 0823   CL 102 06/27/2023 0823   CO2 31 06/27/2023 0823   BUN 13 06/27/2023 0823   CREATININE 0.73 06/27/2023 0823      Component Value Date/Time   CALCIUM 9.5 06/27/2023 0823   ALKPHOS 59 06/27/2023 0823   AST 20 06/27/2023 0823   ALT 17 06/27/2023 0823   BILITOT 0.6 06/27/2023 0823      Lab Results  Component Value Date   TSH 1.62 06/27/2023   Lab Results  Component Value Date   WBC 8.4 06/27/2023   HGB 14.5 06/27/2023   HCT 42.7 06/27/2023   MCV 94.4 06/27/2023   PLT 285.0 06/27/2023      Total time spent on today's visit was ***greater than 60 minutes, including both face-to-face time and nonface-to-face time.  Time included that spent on review of records (prior notes available to me/labs/imaging if pertinent), discussing treatment and goals, answering patient's questions and coordinating care.  Cc:  Tower, Audrie Gallus, MD

## 2023-07-21 ENCOUNTER — Ambulatory Visit: Admitting: Neurology

## 2023-07-21 ENCOUNTER — Other Ambulatory Visit: Payer: Self-pay

## 2023-07-21 ENCOUNTER — Encounter: Payer: Self-pay | Admitting: Neurology

## 2023-07-21 VITALS — BP 126/82 | HR 72 | Ht 64.0 in | Wt 166.0 lb

## 2023-07-21 DIAGNOSIS — D2372 Other benign neoplasm of skin of left lower limb, including hip: Secondary | ICD-10-CM | POA: Insufficient documentation

## 2023-07-21 DIAGNOSIS — G20A1 Parkinson's disease without dyskinesia, without mention of fluctuations: Secondary | ICD-10-CM

## 2023-07-21 DIAGNOSIS — G4709 Other insomnia: Secondary | ICD-10-CM

## 2023-07-21 DIAGNOSIS — D225 Melanocytic nevi of trunk: Secondary | ICD-10-CM | POA: Insufficient documentation

## 2023-07-21 DIAGNOSIS — R251 Tremor, unspecified: Secondary | ICD-10-CM

## 2023-07-21 DIAGNOSIS — R292 Abnormal reflex: Secondary | ICD-10-CM

## 2023-07-21 DIAGNOSIS — L719 Rosacea, unspecified: Secondary | ICD-10-CM | POA: Insufficient documentation

## 2023-07-21 MED ORDER — CARBIDOPA-LEVODOPA 25-100 MG PO TABS: 1.0000 | ORAL_TABLET | Freq: Three times a day (TID) | ORAL | 1 refills | Status: DC

## 2023-07-21 NOTE — Patient Instructions (Addendum)
Start Carbidopa Levodopa as follows: Take 1/2 tablet three times daily, at least 30 minutes before meals (approximately 7am/11am/4pm), for one week Then take 1/2 tablet in the morning, 1/2 tablet in the afternoon, 1 tablet in the evening, at least 30 minutes before meals, for one week Then take 1/2 tablet in the morning, 1 tablet in the afternoon, 1 tablet in the evening, at least 30 minutes before meals, for one week Then take 1 tablet three times daily at 7am/11am/4pm, at least 30 minutes before meals   As a reminder, carbidopa/levodopa can be taken at the same time as a carbohydrate, but we like to have you take your pill either 30 minutes before a protein source or 1 hour after as protein can interfere with carbidopa/levodopa absorption.    A referral to [facility] has been placed for your MRI someone will contact you directly to schedule your appt. They are located at [address] Please contact them directly by calling [phone] with any questions regarding your referral.  

## 2023-07-24 ENCOUNTER — Ambulatory Visit
Admission: RE | Admit: 2023-07-24 | Discharge: 2023-07-24 | Disposition: A | Discharge status: Home / Self Care | Source: Ambulatory Visit | Attending: Neurology | Admitting: Neurology

## 2023-07-24 DIAGNOSIS — R292 Abnormal reflex: Secondary | ICD-10-CM | POA: Diagnosis not present

## 2023-07-24 DIAGNOSIS — R251 Tremor, unspecified: Secondary | ICD-10-CM | POA: Diagnosis not present

## 2023-07-24 DIAGNOSIS — G20A1 Parkinson's disease without dyskinesia, without mention of fluctuations: Secondary | ICD-10-CM

## 2023-07-25 ENCOUNTER — Other Ambulatory Visit: Payer: Self-pay | Admitting: Family Medicine

## 2023-07-27 ENCOUNTER — Telehealth: Payer: Self-pay | Admitting: Family Medicine

## 2023-07-27 NOTE — Telephone Encounter (Signed)
 Type of forms received: physician med release form  Routed to: tower pool   Paperwork received by : Audree Camel   Individual made aware of 3-5 business day turn around (Y/N): Y   Form completed and patient made aware of charges(Y/N): Y    Faxed to : pt requested cb once ppw is comp @ [phone].  Form location:  tower's folder

## 2023-07-27 NOTE — Telephone Encounter (Signed)
Pt notified form ready for pick up

## 2023-07-27 NOTE — Telephone Encounter (Signed)
Done, in IN box 

## 2023-07-27 NOTE — Telephone Encounter (Signed)
 Patient picked up forms.

## 2023-07-27 NOTE — Telephone Encounter (Signed)
 Form in your inbox

## 2023-08-07 ENCOUNTER — Encounter: Payer: Self-pay | Admitting: Neurology

## 2023-08-08 DIAGNOSIS — L814 Other melanin hyperpigmentation: Secondary | ICD-10-CM | POA: Diagnosis not present

## 2023-08-08 DIAGNOSIS — Z7189 Other specified counseling: Secondary | ICD-10-CM | POA: Diagnosis not present

## 2023-08-08 DIAGNOSIS — D225 Melanocytic nevi of trunk: Secondary | ICD-10-CM | POA: Diagnosis not present

## 2023-08-08 DIAGNOSIS — L821 Other seborrheic keratosis: Secondary | ICD-10-CM | POA: Diagnosis not present
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
## 2023-08-10 ENCOUNTER — Ambulatory Visit: Attending: Neurology | Admitting: Physical Therapy

## 2023-08-10 DIAGNOSIS — R262 Difficulty in walking, not elsewhere classified: Secondary | ICD-10-CM | POA: Diagnosis not present

## 2023-08-10 DIAGNOSIS — R269 Unspecified abnormalities of gait and mobility: Secondary | ICD-10-CM | POA: Diagnosis not present

## 2023-08-10 DIAGNOSIS — M6281 Muscle weakness (generalized): Secondary | ICD-10-CM | POA: Diagnosis not present

## 2023-08-10 DIAGNOSIS — R2689 Other abnormalities of gait and mobility: Secondary | ICD-10-CM | POA: Insufficient documentation

## 2023-08-10 DIAGNOSIS — G20A1 Parkinson's disease without dyskinesia, without mention of fluctuations: Secondary | ICD-10-CM | POA: Insufficient documentation

## 2023-08-15 ENCOUNTER — Ambulatory Visit: Admitting: Physical Therapy

## 2023-08-15 DIAGNOSIS — R262 Difficulty in walking, not elsewhere classified: Secondary | ICD-10-CM

## 2023-08-15 DIAGNOSIS — R269 Unspecified abnormalities of gait and mobility: Secondary | ICD-10-CM | POA: Diagnosis not present

## 2023-08-15 DIAGNOSIS — M6281 Muscle weakness (generalized): Secondary | ICD-10-CM

## 2023-08-15 DIAGNOSIS — R2689 Other abnormalities of gait and mobility: Secondary | ICD-10-CM
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
## 2023-08-17 ENCOUNTER — Encounter: Payer: Self-pay | Admitting: Physical Therapy

## 2023-08-17 ENCOUNTER — Ambulatory Visit: Admitting: Physical Therapy

## 2023-08-17 DIAGNOSIS — R269 Unspecified abnormalities of gait and mobility: Secondary | ICD-10-CM

## 2023-08-17 DIAGNOSIS — R262 Difficulty in walking, not elsewhere classified: Secondary | ICD-10-CM

## 2023-08-17 DIAGNOSIS — M6281 Muscle weakness (generalized): Secondary | ICD-10-CM

## 2023-08-17 DIAGNOSIS — R2689 Other abnormalities of gait and mobility: Secondary | ICD-10-CM
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
## 2023-08-18 ENCOUNTER — Encounter: Payer: Self-pay | Admitting: Neurology

## 2023-08-21 ENCOUNTER — Ambulatory Visit: Admitting: Physical Therapy

## 2023-08-21 DIAGNOSIS — M6281 Muscle weakness (generalized): Secondary | ICD-10-CM

## 2023-08-21 DIAGNOSIS — R269 Unspecified abnormalities of gait and mobility: Secondary | ICD-10-CM | POA: Diagnosis not present

## 2023-08-21 DIAGNOSIS — R262 Difficulty in walking, not elsewhere classified: Secondary | ICD-10-CM

## 2023-08-21 DIAGNOSIS — R2689 Other abnormalities of gait and mobility: Secondary | ICD-10-CM
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
## 2023-08-22 ENCOUNTER — Ambulatory Visit

## 2023-08-24 ENCOUNTER — Ambulatory Visit: Attending: Neurology | Admitting: Physical Therapy

## 2023-08-24 DIAGNOSIS — R269 Unspecified abnormalities of gait and mobility: Secondary | ICD-10-CM | POA: Insufficient documentation

## 2023-08-24 DIAGNOSIS — R2681 Unsteadiness on feet: Secondary | ICD-10-CM | POA: Insufficient documentation

## 2023-08-24 DIAGNOSIS — R2689 Other abnormalities of gait and mobility: Secondary | ICD-10-CM | POA: Diagnosis not present

## 2023-08-24 DIAGNOSIS — M6281 Muscle weakness (generalized): Secondary | ICD-10-CM | POA: Diagnosis not present

## 2023-08-24 DIAGNOSIS — R262 Difficulty in walking, not elsewhere classified: Secondary | ICD-10-CM | POA: Insufficient documentation
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
## 2023-08-28 ENCOUNTER — Other Ambulatory Visit (INDEPENDENT_AMBULATORY_CARE_PROVIDER_SITE_OTHER): Payer: PPO

## 2023-08-28 ENCOUNTER — Telehealth: Payer: Self-pay | Admitting: Family Medicine

## 2023-08-28 DIAGNOSIS — I10 Essential (primary) hypertension: Secondary | ICD-10-CM | POA: Diagnosis not present

## 2023-08-28 DIAGNOSIS — E7800 Pure hypercholesterolemia, unspecified: Secondary | ICD-10-CM

## 2023-08-28 DIAGNOSIS — E559 Vitamin D deficiency, unspecified: Secondary | ICD-10-CM | POA: Diagnosis not present

## 2023-08-28 DIAGNOSIS — E039 Hypothyroidism, unspecified: Secondary | ICD-10-CM

## 2023-08-28 LAB — CBC WITH DIFFERENTIAL/PLATELET
Basophils Absolute: 0 10*3/uL (ref 0.0–0.1)
Basophils Relative: 0.4 % (ref 0.0–3.0)
Eosinophils Absolute: 0.4 10*3/uL (ref 0.0–0.7)
Eosinophils Relative: 5.1 % — ABNORMAL HIGH (ref 0.0–5.0)
HCT: 42.1 % (ref 36.0–46.0)
Hemoglobin: 14.2 g/dL (ref 12.0–15.0)
Lymphocytes Relative: 29 % (ref 12.0–46.0)
Lymphs Abs: 2.1 10*3/uL (ref 0.7–4.0)
MCHC: 33.7 g/dL (ref 30.0–36.0)
MCV: 95.1 fl (ref 78.0–100.0)
Monocytes Absolute: 0.7 10*3/uL (ref 0.1–1.0)
Monocytes Relative: 10 % (ref 3.0–12.0)
Neutro Abs: 4.1 10*3/uL (ref 1.4–7.7)
Neutrophils Relative %: 55.5 % (ref 43.0–77.0)
Platelets: 289 10*3/uL (ref 150.0–400.0)
RBC: 4.43 Mil/uL (ref 3.87–5.11)
RDW: 12.7 % (ref 11.5–15.5)
WBC: 7.4 10*3/uL (ref 4.0–10.5)

## 2023-08-28 LAB — VITAMIN D 25 HYDROXY (VIT D DEFICIENCY, FRACTURES): VITD: 66.99 ng/mL (ref 30.00–100.00)

## 2023-08-28 LAB — COMPREHENSIVE METABOLIC PANEL WITH GFR
ALT: 4 U/L (ref 0–35)
AST: 21 U/L (ref 0–37)
Albumin: 4.3 g/dL (ref 3.5–5.2)
Alkaline Phosphatase: 54 U/L (ref 39–117)
BUN: 16 mg/dL (ref 6–23)
CO2: 30 meq/L (ref 19–32)
Calcium: 9.3 mg/dL (ref 8.4–10.5)
Chloride: 103 meq/L (ref 96–112)
Creatinine, Ser: 0.75 mg/dL (ref 0.40–1.20)
GFR: 82.42 mL/min (ref >60.00)
Glucose, Bld: 98 mg/dL (ref 70–99)
Potassium: 4 meq/L (ref 3.5–5.1)
Sodium: 140 meq/L (ref 135–145)
Total Bilirubin: 0.7 mg/dL (ref 0.2–1.2)
Total Protein: 6.8 g/dL (ref 6.0–8.3)

## 2023-08-28 LAB — LIPID PANEL
Cholesterol: 155 mg/dL (ref 0–200)
HDL: 47.4 mg/dL (ref >39.00)
LDL Cholesterol: 94 mg/dL (ref 0–99)
NonHDL: 107.45
Total CHOL/HDL Ratio: 3
Triglycerides: 69 mg/dL (ref 0.0–149.0)
VLDL: 13.8 mg/dL (ref 0.0–40.0)

## 2023-08-28 LAB — TSH: TSH: 1.9 u[IU]/mL (ref 0.35–5.50)

## 2023-08-28 NOTE — Telephone Encounter (Signed)
Labs for cpx  

## 2023-08-29 ENCOUNTER — Ambulatory Visit: Admitting: Physical Therapy

## 2023-08-29 DIAGNOSIS — R262 Difficulty in walking, not elsewhere classified: Secondary | ICD-10-CM

## 2023-08-29 DIAGNOSIS — R269 Unspecified abnormalities of gait and mobility: Secondary | ICD-10-CM

## 2023-08-29 DIAGNOSIS — M6281 Muscle weakness (generalized): Secondary | ICD-10-CM

## 2023-08-29 DIAGNOSIS — R2689 Other abnormalities of gait and mobility: Secondary | ICD-10-CM
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
## 2023-08-30 ENCOUNTER — Ambulatory Visit: Payer: PPO

## 2023-08-30 VITALS — Ht 64.0 in | Wt 166.0 lb

## 2023-08-30 DIAGNOSIS — Z1231 Encounter for screening mammogram for malignant neoplasm of breast: Secondary | ICD-10-CM | POA: Diagnosis not present

## 2023-08-30 DIAGNOSIS — Z0000 Encounter for general adult medical examination without abnormal findings: Secondary | ICD-10-CM | POA: Diagnosis not present

## 2023-08-30 NOTE — Patient Instructions (Signed)
 Kaitlin Carter , Thank you for taking time to come for your Medicare Wellness Visit. I appreciate your ongoing commitment to your health goals. Please review the following plan we discussed and let me know if I can assist you in the future.   Referrals/Orders/Follow-Ups/Clinician Recommendations:   You have an order for:  []   2D Mammogram  [x]   3D Mammogram  []   Bone Density     Please call for appointment:  [facility] [address] [place], [place] [address] [phone]    Make sure to wear two-piece clothing.  No lotions, powders, or deodorants the day of the appointment. Make sure to bring picture ID and insurance card.  Bring list of medications you are currently taking including any supplements.    This is a list of the screening recommended for you and due dates:  Health Maintenance  Topic Date Due   COVID-19 Vaccine (4 - 2024-25 season) 07/12/2024*   Flu Shot  11/24/2023   Medicare Annual Wellness Visit  08/29/2024   Mammogram  12/29/2024   Colon Cancer Screening  03/30/2027   DTaP/Tdap/Td vaccine (3 - Td or Tdap) 12/23/2027   Pneumonia Vaccine  Completed   DEXA scan (bone density measurement)  Completed   Hepatitis C Screening  Completed   Zoster (Shingles) Vaccine  Completed   HPV Vaccine  Aged Out   Meningitis B Vaccine  Aged Out  *Topic was postponed. The date shown is not the original due date.    Advanced directives: (Copy Requested) Please bring a copy of your health care power of attorney and living will to the office to be added to your chart at your convenience. You can mail to [facility] [address] [place], [place] [address] or email to [email or web address]  Next Medicare Annual Wellness Visit scheduled for next year: Yes 08/30/24 @ 2:20pm televisit
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
## 2023-08-31 ENCOUNTER — Ambulatory Visit: Admitting: Physical Therapy

## 2023-08-31 DIAGNOSIS — R269 Unspecified abnormalities of gait and mobility: Secondary | ICD-10-CM

## 2023-08-31 DIAGNOSIS — R262 Difficulty in walking, not elsewhere classified: Secondary | ICD-10-CM

## 2023-08-31 DIAGNOSIS — M6281 Muscle weakness (generalized): Secondary | ICD-10-CM

## 2023-08-31 DIAGNOSIS — R2689 Other abnormalities of gait and mobility: Secondary | ICD-10-CM
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
## 2023-09-04 ENCOUNTER — Encounter: Payer: PPO | Admitting: Family Medicine

## 2023-09-05 ENCOUNTER — Ambulatory Visit: Admitting: Physical Therapy

## 2023-09-05 DIAGNOSIS — R2689 Other abnormalities of gait and mobility: Secondary | ICD-10-CM

## 2023-09-05 DIAGNOSIS — M6281 Muscle weakness (generalized): Secondary | ICD-10-CM

## 2023-09-05 DIAGNOSIS — R262 Difficulty in walking, not elsewhere classified: Secondary | ICD-10-CM

## 2023-09-05 DIAGNOSIS — R269 Unspecified abnormalities of gait and mobility: Secondary | ICD-10-CM | POA: Diagnosis not present
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
## 2023-09-06 ENCOUNTER — Encounter: Payer: Self-pay | Admitting: Family Medicine

## 2023-09-06 ENCOUNTER — Ambulatory Visit (INDEPENDENT_AMBULATORY_CARE_PROVIDER_SITE_OTHER): Payer: PPO | Admitting: Family Medicine

## 2023-09-06 VITALS — BP 120/80 | HR 71 | Temp 97.8°F | Ht 63.5 in | Wt 164.0 lb

## 2023-09-06 DIAGNOSIS — Z860100 Personal history of colon polyps, unspecified: Secondary | ICD-10-CM

## 2023-09-06 DIAGNOSIS — R7303 Prediabetes: Secondary | ICD-10-CM

## 2023-09-06 DIAGNOSIS — E7800 Pure hypercholesterolemia, unspecified: Secondary | ICD-10-CM | POA: Diagnosis not present

## 2023-09-06 DIAGNOSIS — I10 Essential (primary) hypertension: Secondary | ICD-10-CM

## 2023-09-06 DIAGNOSIS — E039 Hypothyroidism, unspecified: Secondary | ICD-10-CM | POA: Diagnosis not present

## 2023-09-06 DIAGNOSIS — G20A1 Parkinson's disease without dyskinesia, without mention of fluctuations: Secondary | ICD-10-CM | POA: Diagnosis not present

## 2023-09-06 DIAGNOSIS — E559 Vitamin D deficiency, unspecified: Secondary | ICD-10-CM | POA: Diagnosis not present

## 2023-09-06 DIAGNOSIS — Z0000 Encounter for general adult medical examination without abnormal findings: Secondary | ICD-10-CM | POA: Diagnosis not present

## 2023-09-06 MED ORDER — LEVOTHYROXINE SODIUM 150 MCG PO TABS: ORAL_TABLET | ORAL | 3 refills | Status: DC

## 2023-09-06 NOTE — Assessment & Plan Note (Signed)
 Hypothyroidism  Pt has no clinical changes No change in energy level/ hair or skin/ edema and no tremor Lab Results  Component Value Date   TSH 1.90 08/28/2023     Continues levothyroxine  150 mcg 1/2 tablet M, W, F, and sun  1 tablet Tues, thurs

## 2023-09-06 NOTE — Assessment & Plan Note (Signed)
 bp in fair control at this time  BP Readings from Last 1 Encounters:  09/06/23 120/80   No changes needed Most recent labs reviewed  Disc lifstyle change with low sodium diet and exercise  Plan to continue losartan  100 mg daily
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
## 2023-09-06 NOTE — Assessment & Plan Note (Signed)
 Reviewed health habits including diet and exercise and skin cancer prevention Reviewed appropriate screening tests for age  Also reviewed health mt list, fam hx and immunization status , as well as social and family history   See HPI Labs reviewed and ordered Health Maintenance  Topic Date Due   COVID-19 Vaccine (4 - 2024-25 season) 07/12/2024*   Flu Shot  11/24/2023   Medicare Annual Wellness Visit  08/29/2024   Mammogram  12/29/2024   Colon Cancer Screening  03/30/2027   DTaP/Tdap/Td vaccine (3 - Td or Tdap) 12/23/2027   Pneumonia Vaccine  Completed   DEXA scan (bone density measurement)  Completed   Hepatitis C Screening  Completed   Zoster (Shingles) Vaccine  Completed   HPV Vaccine  Aged Out   Meningitis B Vaccine  Aged Out  *Topic was postponed. The date shown is not the original due date.    Sees gyn yearly for exam, pap , mammo and dexa  Discussed fall prevention, supplements and exercise for bone density  PHQ 0  Commended good exercise

## 2023-09-06 NOTE — Assessment & Plan Note (Signed)
 Disc goals for lipids and reasons to control them Rev last labs with pt Rev low sat fat diet in detail Labs are fairly stable with LDL below 100

## 2023-09-06 NOTE — Assessment & Plan Note (Signed)
 Last vitamin D  Lab Results  Component Value Date   VD25OH 66.99 08/28/2023   Vitamin D  level is therapeutic with current supplementation Disc importance of this to bone and overall health

## 2023-09-06 NOTE — Assessment & Plan Note (Signed)
 Colonoscopy 2023 with 3 y recall

## 2023-09-06 NOTE — Assessment & Plan Note (Signed)
 Lab Results  Component Value Date   HGBA1C 5.9 06/27/2023   HGBA1C 6.2 08/26/2022   HGBA1C 6.0 08/13/2021   disc imp of low glycemic diet and wt loss to prevent DM2

## 2023-09-06 NOTE — Patient Instructions (Signed)
 Keep exercising  Keep eating well   Try to get most of your carbohydrates from produce (with the exception of white potatoes) and whole grains Eat less bread/pasta/rice/snack foods/cereals/sweets and other items from the middle of the grocery store (processed carbs)   Continue sun protection   Labs look stable Average blood sugar is improved

## 2023-09-06 NOTE — Assessment & Plan Note (Signed)
 Seeing neurology Dr Tat Doing well  Cardiodopa/levodopa  has been very helpful  Good exercise also

## 2023-09-07 ENCOUNTER — Ambulatory Visit: Admitting: Physical Therapy

## 2023-09-07 DIAGNOSIS — R262 Difficulty in walking, not elsewhere classified: Secondary | ICD-10-CM

## 2023-09-07 DIAGNOSIS — R269 Unspecified abnormalities of gait and mobility: Secondary | ICD-10-CM

## 2023-09-07 DIAGNOSIS — M6281 Muscle weakness (generalized): Secondary | ICD-10-CM

## 2023-09-07 DIAGNOSIS — R2681 Unsteadiness on feet: Secondary | ICD-10-CM

## 2023-09-07 DIAGNOSIS — Z6828 Body mass index (BMI) 28.0-28.9, adult: Secondary | ICD-10-CM | POA: Diagnosis not present

## 2023-09-07 DIAGNOSIS — R2689 Other abnormalities of gait and mobility: Secondary | ICD-10-CM

## 2023-09-07 DIAGNOSIS — Z01419 Encounter for gynecological examination (general) (routine) without abnormal findings: Secondary | ICD-10-CM | POA: Diagnosis not present
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
## 2023-09-12 ENCOUNTER — Ambulatory Visit: Admitting: Physical Therapy

## 2023-09-12 DIAGNOSIS — M6281 Muscle weakness (generalized): Secondary | ICD-10-CM

## 2023-09-12 DIAGNOSIS — R262 Difficulty in walking, not elsewhere classified: Secondary | ICD-10-CM

## 2023-09-12 DIAGNOSIS — R2689 Other abnormalities of gait and mobility: Secondary | ICD-10-CM

## 2023-09-12 DIAGNOSIS — R269 Unspecified abnormalities of gait and mobility: Secondary | ICD-10-CM | POA: Diagnosis not present

## 2023-09-12 DIAGNOSIS — R2681 Unsteadiness on feet: Secondary | ICD-10-CM
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
## 2023-09-14 ENCOUNTER — Ambulatory Visit: Admitting: Physical Therapy

## 2023-09-14 DIAGNOSIS — R2689 Other abnormalities of gait and mobility: Secondary | ICD-10-CM

## 2023-09-14 DIAGNOSIS — R2681 Unsteadiness on feet: Secondary | ICD-10-CM

## 2023-09-14 DIAGNOSIS — R262 Difficulty in walking, not elsewhere classified: Secondary | ICD-10-CM

## 2023-09-14 DIAGNOSIS — R269 Unspecified abnormalities of gait and mobility: Secondary | ICD-10-CM | POA: Diagnosis not present

## 2023-09-14 DIAGNOSIS — M6281 Muscle weakness (generalized): Secondary | ICD-10-CM
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
## 2023-09-19 ENCOUNTER — Ambulatory Visit: Admitting: Physical Therapy

## 2023-09-19 DIAGNOSIS — R269 Unspecified abnormalities of gait and mobility: Secondary | ICD-10-CM | POA: Diagnosis not present

## 2023-09-19 DIAGNOSIS — R2681 Unsteadiness on feet: Secondary | ICD-10-CM

## 2023-09-19 DIAGNOSIS — R2689 Other abnormalities of gait and mobility: Secondary | ICD-10-CM

## 2023-09-19 DIAGNOSIS — R262 Difficulty in walking, not elsewhere classified: Secondary | ICD-10-CM
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
## 2023-09-21 ENCOUNTER — Ambulatory Visit: Admitting: Physical Therapy

## 2023-09-21 DIAGNOSIS — R2689 Other abnormalities of gait and mobility: Secondary | ICD-10-CM

## 2023-09-21 DIAGNOSIS — R269 Unspecified abnormalities of gait and mobility: Secondary | ICD-10-CM

## 2023-09-21 DIAGNOSIS — R2681 Unsteadiness on feet: Secondary | ICD-10-CM

## 2023-09-21 DIAGNOSIS — R262 Difficulty in walking, not elsewhere classified: Secondary | ICD-10-CM
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
## 2023-09-26 ENCOUNTER — Ambulatory Visit: Attending: Neurology

## 2023-09-26 DIAGNOSIS — R2689 Other abnormalities of gait and mobility: Secondary | ICD-10-CM | POA: Insufficient documentation

## 2023-09-26 DIAGNOSIS — R262 Difficulty in walking, not elsewhere classified: Secondary | ICD-10-CM | POA: Diagnosis not present

## 2023-09-26 DIAGNOSIS — M6281 Muscle weakness (generalized): Secondary | ICD-10-CM | POA: Diagnosis not present

## 2023-09-26 DIAGNOSIS — R269 Unspecified abnormalities of gait and mobility: Secondary | ICD-10-CM | POA: Diagnosis not present

## 2023-09-26 DIAGNOSIS — R2681 Unsteadiness on feet: Secondary | ICD-10-CM | POA: Diagnosis not present
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
## 2023-09-28 ENCOUNTER — Ambulatory Visit: Admitting: Physical Therapy

## 2023-09-28 DIAGNOSIS — M6281 Muscle weakness (generalized): Secondary | ICD-10-CM

## 2023-09-28 DIAGNOSIS — R269 Unspecified abnormalities of gait and mobility: Secondary | ICD-10-CM

## 2023-09-28 DIAGNOSIS — R2689 Other abnormalities of gait and mobility: Secondary | ICD-10-CM

## 2023-09-28 DIAGNOSIS — R262 Difficulty in walking, not elsewhere classified: Secondary | ICD-10-CM

## 2023-09-28 DIAGNOSIS — R2681 Unsteadiness on feet: Secondary | ICD-10-CM
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
## 2023-10-03 ENCOUNTER — Ambulatory Visit

## 2023-10-03 DIAGNOSIS — R269 Unspecified abnormalities of gait and mobility: Secondary | ICD-10-CM

## 2023-10-03 DIAGNOSIS — R262 Difficulty in walking, not elsewhere classified: Secondary | ICD-10-CM

## 2023-10-03 DIAGNOSIS — R2681 Unsteadiness on feet: Secondary | ICD-10-CM

## 2023-10-03 DIAGNOSIS — R2689 Other abnormalities of gait and mobility: Secondary | ICD-10-CM
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
## 2023-10-05 ENCOUNTER — Ambulatory Visit

## 2023-10-05 DIAGNOSIS — R262 Difficulty in walking, not elsewhere classified: Secondary | ICD-10-CM

## 2023-10-05 DIAGNOSIS — R269 Unspecified abnormalities of gait and mobility: Secondary | ICD-10-CM | POA: Diagnosis not present

## 2023-10-05 DIAGNOSIS — R2681 Unsteadiness on feet: Secondary | ICD-10-CM

## 2023-10-05 DIAGNOSIS — R2689 Other abnormalities of gait and mobility: Secondary | ICD-10-CM

## 2023-10-10 ENCOUNTER — Ambulatory Visit

## 2023-10-10 DIAGNOSIS — R2681 Unsteadiness on feet: Secondary | ICD-10-CM

## 2023-10-10 DIAGNOSIS — R2689 Other abnormalities of gait and mobility: Secondary | ICD-10-CM

## 2023-10-10 DIAGNOSIS — R269 Unspecified abnormalities of gait and mobility: Secondary | ICD-10-CM | POA: Diagnosis not present

## 2023-10-10 DIAGNOSIS — R262 Difficulty in walking, not elsewhere classified: Secondary | ICD-10-CM

## 2023-10-10 DIAGNOSIS — M6281 Muscle weakness (generalized): Secondary | ICD-10-CM
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
## 2023-10-12 ENCOUNTER — Ambulatory Visit

## 2023-10-12 DIAGNOSIS — R269 Unspecified abnormalities of gait and mobility: Secondary | ICD-10-CM

## 2023-10-12 DIAGNOSIS — R2689 Other abnormalities of gait and mobility: Secondary | ICD-10-CM

## 2023-10-12 DIAGNOSIS — M6281 Muscle weakness (generalized): Secondary | ICD-10-CM

## 2023-10-12 DIAGNOSIS — R2681 Unsteadiness on feet: Secondary | ICD-10-CM

## 2023-10-12 DIAGNOSIS — R262 Difficulty in walking, not elsewhere classified: Secondary | ICD-10-CM
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
## 2023-10-17 ENCOUNTER — Ambulatory Visit

## 2023-10-17 DIAGNOSIS — R269 Unspecified abnormalities of gait and mobility: Secondary | ICD-10-CM | POA: Diagnosis not present

## 2023-10-17 DIAGNOSIS — R262 Difficulty in walking, not elsewhere classified: Secondary | ICD-10-CM

## 2023-10-17 DIAGNOSIS — R2681 Unsteadiness on feet: Secondary | ICD-10-CM

## 2023-10-17 DIAGNOSIS — R2689 Other abnormalities of gait and mobility: Secondary | ICD-10-CM

## 2023-10-17 DIAGNOSIS — M6281 Muscle weakness (generalized): Secondary | ICD-10-CM
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
## 2023-10-19 ENCOUNTER — Other Ambulatory Visit: Payer: Self-pay | Admitting: Family Medicine

## 2023-10-19 ENCOUNTER — Ambulatory Visit

## 2023-10-24 ENCOUNTER — Ambulatory Visit: Admitting: Physical Therapy

## 2023-10-26 ENCOUNTER — Ambulatory Visit: Attending: Neurology

## 2023-10-26 ENCOUNTER — Ambulatory Visit: Admitting: Neurology

## 2023-10-26 DIAGNOSIS — R262 Difficulty in walking, not elsewhere classified: Secondary | ICD-10-CM | POA: Diagnosis not present

## 2023-10-26 DIAGNOSIS — R269 Unspecified abnormalities of gait and mobility: Secondary | ICD-10-CM | POA: Insufficient documentation

## 2023-10-26 DIAGNOSIS — M6281 Muscle weakness (generalized): Secondary | ICD-10-CM | POA: Insufficient documentation

## 2023-10-26 DIAGNOSIS — R2689 Other abnormalities of gait and mobility: Secondary | ICD-10-CM | POA: Diagnosis not present

## 2023-10-26 DIAGNOSIS — R2681 Unsteadiness on feet: Secondary | ICD-10-CM | POA: Insufficient documentation
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
## 2023-10-31 ENCOUNTER — Ambulatory Visit
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
## 2023-11-02 ENCOUNTER — Ambulatory Visit

## 2023-11-02 DIAGNOSIS — R262 Difficulty in walking, not elsewhere classified: Secondary | ICD-10-CM

## 2023-11-02 DIAGNOSIS — R2681 Unsteadiness on feet: Secondary | ICD-10-CM

## 2023-11-02 DIAGNOSIS — R2689 Other abnormalities of gait and mobility: Secondary | ICD-10-CM

## 2023-11-02 DIAGNOSIS — R269 Unspecified abnormalities of gait and mobility: Secondary | ICD-10-CM

## 2023-11-02 DIAGNOSIS — M6281 Muscle weakness (generalized): Secondary | ICD-10-CM

## 2023-11-07 ENCOUNTER — Ambulatory Visit

## 2023-11-09 ENCOUNTER — Ambulatory Visit

## 2023-11-09 DIAGNOSIS — M6281 Muscle weakness (generalized): Secondary | ICD-10-CM

## 2023-11-09 DIAGNOSIS — R269 Unspecified abnormalities of gait and mobility: Secondary | ICD-10-CM

## 2023-11-09 DIAGNOSIS — R2689 Other abnormalities of gait and mobility: Secondary | ICD-10-CM

## 2023-11-09 DIAGNOSIS — R262 Difficulty in walking, not elsewhere classified: Secondary | ICD-10-CM

## 2023-11-09 DIAGNOSIS — R2681 Unsteadiness on feet: Secondary | ICD-10-CM
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
## 2023-11-14 ENCOUNTER — Ambulatory Visit

## 2023-11-14 NOTE — Progress Notes (Unsigned)
 Assessment/Plan:   1.  Parkinsons Disease, diagnosed March, 2025  -Continue carbidopa /levodopa  25/100, 1 tablet 3 times per day.  She looks markedly improved on the medication  -We discussed that it used to be thought that levodopa  would increase risk of melanoma but now it is believed that Parkinsons itself likely increases risk of melanoma. she is to get regular skin checks.  She does this one time per year.    -given information on Parkinsons Disease symposium and local movement fund  2.  Insomnia  - On melatonin, 5mg ,  prn and doing well   Subjective:   Kaitlin Carter was seen today in follow up for Parkinsons disease.  My previous records were reviewed prior to todays visit as well as outside records available to me.  The diagnosis was made last visit and patient was started on levodopa .  Patient is tolerating medication well, without side effects.  Notes some occasional twitching in the calf but rare cramping.  Patient also had an MRI of the brain since last visit, which was unremarkable.  Pt denies falls.  Patient has been attending PT and just finished.  She is in [employer] now, treadmill and bike.  Those notes have been reviewed.  Pt denies lightheadedness, near syncope.  No hallucinations.  Mood has been good.  Saw her primary care for her annual physical on May 14.  Notes are reviewed.  Current prescribed movement disorder medications: Carbidopa /levodopa  25/100, 1 tablet 3 times per day (started last visit)   ALLERGIES:   Allergies  Allergen Reactions   Atorvastatin  Other (See Comments)    Mild muscle aches    Verapamil     ineffective   Amlodipine Besy-Benazepril Hcl     cough   Hydrochlorothiazide-Triamterene     ? Induced gout    CURRENT MEDICATIONS:  Current Meds  Medication Sig   aspirin EC 81 MG tablet Take 81 mg by mouth daily.   carbidopa -levodopa  (SINEMET  IR) 25-100 MG tablet Take 1 tablet by mouth 3 (three) times daily. 7am/11am/4pm   Cholecalciferol  (VITAMIN D3) 1.25 MG (50000 UT) TABS Take 1 tablet by mouth daily.   Coenzyme Q10 200 MG TABS Take by mouth.   levothyroxine  (SYNTHROID ) 150 MCG tablet TAKE ONE-HALF TABLET BY  MOUTH ON MONDAY WEDNESDAY  FRIDAY AND SUNDAY THEN 1  TABLET BY MOUTH ON TUESDAY  THURSDAY AND SATURDAY   losartan  (COZAAR ) 100 MG tablet TAKE 1 TABLET BY MOUTH EVERY DAY   Magnesium 400 MG CAPS Take 1 capsule by mouth every other day.   Multiple Vitamin (MULTIVITAMIN) tablet Take 1 tablet by mouth daily.     Objective:   PHYSICAL EXAMINATION:    VITALS:   Vitals:   11/16/23 0826  BP: 124/72  Pulse: 72  SpO2: 97%  Weight: 167 lb 3.2 oz (75.8 kg)    GEN:  The patient appears stated age and is in NAD. HEENT:  Normocephalic, atraumatic.  The mucous membranes are moist. The superficial temporal arteries are without ropiness or tenderness. CV:  RRR Lungs:  CTAB Neck/HEME:  There are no carotid bruits bilaterally.  Neurological examination:  Orientation: The patient is alert and oriented x3. Cranial nerves: There is good facial symmetry with facial hypomimia. The speech is fluent and clear. Soft palate rises symmetrically and there is no tongue deviation. Hearing is intact to conversational tone. Sensation: Sensation is intact to light touch throughout Motor: Strength is at least antigravity x4.  Movement examination: Tone: There is min  to mild increased tone in the bilateral UE (improved) Abnormal movements: there is no tremor today (improved) Coordination:  There is no decremation with any form of RAMS, including alternating supination and pronation of the forearm, hand opening and closing, finger taps, heel taps and toe taps (marked improved) Gait and Station: The patient has no difficulty arising out of a deep-seated chair without the use of the hands. The patient's stride length is good with good arm swing bilaterally.  The patient has a positive pull test.       I have reviewed and interpreted the  following labs independently    Chemistry      Component Value Date/Time   NA 140 08/28/2023 0811   K 4.0 08/28/2023 0811   CL 103 08/28/2023 0811   CO2 30 08/28/2023 0811   BUN 16 08/28/2023 0811   CREATININE 0.75 08/28/2023 0811      Component Value Date/Time   CALCIUM  9.3 08/28/2023 0811   ALKPHOS 54 08/28/2023 0811   AST 21 08/28/2023 0811   ALT 4 08/28/2023 0811   BILITOT 0.7 08/28/2023 0811       Lab Results  Component Value Date   WBC 7.4 08/28/2023   HGB 14.2 08/28/2023   HCT 42.1 08/28/2023   MCV 95.1 08/28/2023   PLT 289.0 08/28/2023    Lab Results  Component Value Date   TSH 1.90 08/28/2023     Total time spent on today's visit was 30 minutes, including both face-to-face time and nonface-to-face time.  Time included that spent on review of records (prior notes available to me/labs/imaging if pertinent), discussing treatment and goals, answering patient's questions and coordinating care.  Cc:  Tower, Laine LABOR, MD

## 2023-11-16 ENCOUNTER — Ambulatory Visit

## 2023-11-16 ENCOUNTER — Encounter: Payer: Self-pay | Admitting: Neurology

## 2023-11-16 ENCOUNTER — Ambulatory Visit: Admitting: Neurology

## 2023-11-16 VITALS — BP 124/72 | HR 72 | Wt 167.2 lb

## 2023-11-16 DIAGNOSIS — G20A1 Parkinson's disease without dyskinesia, without mention of fluctuations: Secondary | ICD-10-CM

## 2023-11-16 MED ORDER — CARBIDOPA-LEVODOPA 25-100 MG PO TABS: 1.0000 | ORAL_TABLET | Freq: Three times a day (TID) | ORAL | 1 refills | Status: AC

## 2023-11-16 NOTE — Patient Instructions (Signed)

## 2023-11-21 ENCOUNTER — Ambulatory Visit

## 2023-11-23 ENCOUNTER — Ambulatory Visit

## 2023-11-28 ENCOUNTER — Ambulatory Visit

## 2023-11-30 ENCOUNTER — Ambulatory Visit

## 2023-12-05 ENCOUNTER — Ambulatory Visit

## 2023-12-07 ENCOUNTER — Ambulatory Visit

## 2023-12-11 ENCOUNTER — Ambulatory Visit

## 2023-12-20 ENCOUNTER — Ambulatory Visit: Admitting: Physical Therapy

## 2023-12-27 ENCOUNTER — Ambulatory Visit: Admitting: Physical Therapy

## 2023-12-30 ENCOUNTER — Other Ambulatory Visit: Payer: Self-pay | Admitting: Family Medicine

## 2024-01-01 ENCOUNTER — Ambulatory Visit
Admission: RE | Admit: 2024-01-01 | Discharge: 2024-01-01 | Disposition: A | Discharge status: Home / Self Care | Source: Ambulatory Visit | Attending: Family Medicine | Admitting: Family Medicine

## 2024-01-01 DIAGNOSIS — Z1231 Encounter for screening mammogram for malignant neoplasm of breast: Secondary | ICD-10-CM | POA: Diagnosis not present

## 2024-01-03 ENCOUNTER — Ambulatory Visit: Admitting: Physical Therapy

## 2024-01-04 ENCOUNTER — Ambulatory Visit: Payer: Self-pay | Admitting: Family Medicine

## 2024-01-10 ENCOUNTER — Ambulatory Visit: Admitting: Physical Therapy

## 2024-01-17 ENCOUNTER — Ambulatory Visit: Admitting: Physical Therapy

## 2024-01-24 ENCOUNTER — Ambulatory Visit: Admitting: Physical Therapy

## 2024-01-31 ENCOUNTER — Ambulatory Visit: Admitting: Physical Therapy

## 2024-02-07 ENCOUNTER — Ambulatory Visit: Admitting: Physical Therapy
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
## 2024-03-26 ENCOUNTER — Ambulatory Visit: Attending: Cardiology | Admitting: Cardiology

## 2024-03-26 ENCOUNTER — Encounter: Payer: Self-pay | Admitting: Cardiology

## 2024-03-26 VITALS — BP 138/85 | HR 72 | Ht 64.0 in | Wt 166.4 lb

## 2024-03-26 DIAGNOSIS — I10 Essential (primary) hypertension: Secondary | ICD-10-CM

## 2024-03-26 DIAGNOSIS — E785 Hyperlipidemia, unspecified: Secondary | ICD-10-CM

## 2024-03-26 DIAGNOSIS — R9431 Abnormal electrocardiogram [ECG] [EKG]: Secondary | ICD-10-CM

## 2024-03-26 NOTE — Patient Instructions (Signed)
 Medication Instructions:  Your physician recommends that you continue on your current medications as directed. Please refer to the Current Medication list given to you today.  *If you need a refill on your cardiac medications before your next appointment, please call your pharmacy*  Lab Work: none If you have labs (blood work) drawn today and your tests are completely normal, you will receive your results only by: MyChart Message (if you have MyChart) OR A paper copy in the mail If you have any lab test that is abnormal or we need to change your treatment, we will call you to review the results.  Testing/Procedures: Echo  Your physician has requested that you have an echocardiogram. Echocardiography is a painless test that uses sound waves to create images of your heart. It provides your doctor with information about the size and shape of your heart and how well your heart's chambers and valves are working. This procedure takes approximately one hour. There are no restrictions for this procedure. Please do NOT wear cologne, perfume, aftershave, or lotions (deodorant is allowed). Please arrive 15 minutes prior to your appointment time.  Please note: We ask at that you not bring children with you during ultrasound (echo/ vascular) testing. Due to room size and safety concerns, children are not allowed in the ultrasound rooms during exams. Our front office staff cannot provide observation of children in our lobby area while testing is being conducted. An adult accompanying a patient to their appointment will only be allowed in the ultrasound room at the discretion of the ultrasound technician under special circumstances. We apologize for any inconvenience.   Follow-Up: At [facility], you and your health needs are our priority.  As part of our continuing mission to provide you with exceptional heart care, our providers are all part of one team.  This team includes your primary  Cardiologist (physician) and Advanced Practice Providers or APPs (Physician Assistants and Nurse Practitioners) who all work together to provide you with the care you need, when you need it.  Your next appointment:   1 year  Provider:   Dr. Kate  We recommend signing up for the patient portal called MyChart.  Sign up information is provided on this After Visit Summary.  MyChart is used to connect with patients for Virtual Visits (Telemedicine).  Patients are able to view lab/test results, encounter notes, upcoming appointments, etc.  Non-urgent messages can be sent to your provider as well.   To learn more about what you can do with MyChart, go to [email or web address].   Other Instructions none

## 2024-04-30 ENCOUNTER — Encounter: Payer: Self-pay | Admitting: Family Medicine

## 2024-05-07 ENCOUNTER — Ambulatory Visit (HOSPITAL_COMMUNITY)
Admission: RE | Admit: 2024-05-07 | Discharge: 2024-05-07 | Disposition: A | Discharge status: Home / Self Care | Source: Ambulatory Visit | Attending: Cardiology | Admitting: Cardiology

## 2024-05-07 ENCOUNTER — Ambulatory Visit: Payer: Self-pay | Admitting: Cardiology

## 2024-05-07 DIAGNOSIS — R9431 Abnormal electrocardiogram [ECG] [EKG]: Secondary | ICD-10-CM | POA: Diagnosis present

## 2024-05-07 LAB — ECHOCARDIOGRAM COMPLETE
Area-P 1/2: 3.02 cm2
S' Lateral: 2.3 cm

## 2024-05-24 NOTE — Progress Notes (Unsigned)
 "   Assessment/Plan:   1.  Parkinsons Disease, diagnosed March, 2025  -Continue carbidopa /levodopa  25/100, 1 tablet 3 times per day.  She looks markedly improved on the medication  -We discussed that it used to be thought that levodopa  would increase risk of melanoma but now it is believed that Parkinsons itself likely increases risk of melanoma. she is to get regular skin checks.  She does this one time per year.     2.  Insomnia  - On melatonin, 5mg ,  prn and doing well   Subjective:   Kaitlin Carter was seen today in follow up for Parkinsons disease.  My previous records were reviewed prior to todays visit as well as outside records available to me.  Patient continues to do well with her Parkinson's disease, without falls, lightheadedness/near syncope, hallucinations.  She saw cardiology December 2.  Notes are reviewed.  She is doing well from that aspect.  Current prescribed movement disorder medications: Carbidopa /levodopa  25/100, 1 tablet 3 times per day   ALLERGIES:   Allergies  Allergen Reactions   Atorvastatin  Other (See Comments)    Mild muscle aches    Verapamil     ineffective   Amlodipine Besy-Benazepril Hcl     cough   Hydrochlorothiazide-Triamterene     ? Induced gout    CURRENT MEDICATIONS:  No outpatient medications have been marked as taking for the 05/28/24 encounter (Appointment) with Heriberto Stmartin, Asberry GORMAN, DO.     Objective:   PHYSICAL EXAMINATION:    VITALS:   There were no vitals filed for this visit.   GEN:  The patient appears stated age and is in NAD. HEENT:  Normocephalic, atraumatic.  The mucous membranes are moist. The superficial temporal arteries are without ropiness or tenderness. CV:  RRR Lungs:  CTAB Neck/HEME:  There are no carotid bruits bilaterally.  Neurological examination:  Orientation: The patient is alert and oriented x3. Cranial nerves: There is good facial symmetry with facial hypomimia. The speech is fluent and clear. Soft  palate rises symmetrically and there is no tongue deviation. Hearing is intact to conversational tone. Sensation: Sensation is intact to light touch throughout Motor: Strength is at least antigravity x4.  Movement examination: Tone: There is min to mild increased tone in the bilateral UE (improved) Abnormal movements: there is no tremor today (improved) Coordination:  There is no decremation with any form of RAMS, including alternating supination and pronation of the forearm, hand opening and closing, finger taps, heel taps and toe taps (marked improved) Gait and Station: The patient has no difficulty arising out of a deep-seated chair without the use of the hands. The patient's stride length is good with good arm swing bilaterally.  The patient has a positive pull test.       I have reviewed and interpreted the following labs independently    Chemistry      Component Value Date/Time   NA 140 08/28/2023 0811   K 4.0 08/28/2023 0811   CL 103 08/28/2023 0811   CO2 30 08/28/2023 0811   BUN 16 08/28/2023 0811   CREATININE 0.75 08/28/2023 0811      Component Value Date/Time   CALCIUM  9.3 08/28/2023 0811   ALKPHOS 54 08/28/2023 0811   AST 21 08/28/2023 0811   ALT 4 08/28/2023 0811   BILITOT 0.7 08/28/2023 0811       Lab Results  Component Value Date   WBC 7.4 08/28/2023   HGB 14.2 08/28/2023   HCT 42.1 08/28/2023  MCV 95.1 08/28/2023   PLT 289.0 08/28/2023    Lab Results  Component Value Date   TSH 1.90 08/28/2023     Total time spent on today's visit was *** minutes, including both face-to-face time and nonface-to-face time.  Time included that spent on review of records (prior notes available to me/labs/imaging if pertinent), discussing treatment and goals, answering patient's questions and coordinating care.  Cc:  Tower, Laine LABOR, MD  "

## 2024-05-28 ENCOUNTER — Ambulatory Visit: Admitting: Neurology

## 2024-08-06 ENCOUNTER — Ambulatory Visit: Admitting: Neurology

## 2024-08-30 ENCOUNTER — Ambulatory Visit

## 2024-09-03 ENCOUNTER — Other Ambulatory Visit

## 2024-09-10 ENCOUNTER — Encounter: Admitting: Family Medicine
